# Patient Record
Sex: Female | Born: 1990
Health system: Southern US, Community
[De-identification: ages and names within clinical notes are randomized; demographics above are authoritative.]

## PROBLEM LIST (undated history)

## (undated) DIAGNOSIS — I10 Essential (primary) hypertension: Secondary | ICD-10-CM

---
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
## 2010-04-03 ENCOUNTER — Emergency Department (HOSPITAL_COMMUNITY): Admission: EM | Admit: 2010-04-03 | Discharge: 2010-04-03 | Payer: Self-pay | Admitting: Family Medicine

## 2016-10-07 ENCOUNTER — Emergency Department (HOSPITAL_COMMUNITY): Payer: Self-pay

## 2016-10-07 ENCOUNTER — Emergency Department (HOSPITAL_COMMUNITY)
Admission: EM | Admit: 2016-10-07 | Discharge: 2016-10-07 | Disposition: A | Discharge status: Home / Self Care | Payer: Self-pay | Attending: Emergency Medicine | Admitting: Emergency Medicine

## 2016-10-07 ENCOUNTER — Encounter (HOSPITAL_COMMUNITY): Payer: Self-pay

## 2016-10-07 DIAGNOSIS — X58XXXA Exposure to other specified factors, initial encounter: Secondary | ICD-10-CM | POA: Insufficient documentation

## 2016-10-07 DIAGNOSIS — Y990 Civilian activity done for income or pay: Secondary | ICD-10-CM | POA: Insufficient documentation

## 2016-10-07 DIAGNOSIS — Y9389 Activity, other specified: Secondary | ICD-10-CM | POA: Insufficient documentation

## 2016-10-07 DIAGNOSIS — Y9289 Other specified places as the place of occurrence of the external cause: Secondary | ICD-10-CM | POA: Insufficient documentation

## 2016-10-07 DIAGNOSIS — R079 Chest pain, unspecified: Secondary | ICD-10-CM | POA: Insufficient documentation

## 2016-10-07 DIAGNOSIS — F17210 Nicotine dependence, cigarettes, uncomplicated: Secondary | ICD-10-CM | POA: Insufficient documentation

## 2016-10-07 LAB — COMPREHENSIVE METABOLIC PANEL
ALK PHOS: 81 U/L (ref 38–126)
ALT: 18 U/L (ref 14–54)
ANION GAP: 10 (ref 5–15)
AST: 22 U/L (ref 15–41)
Albumin: 3.8 g/dL (ref 3.5–5.0)
BILIRUBIN TOTAL: 0.5 mg/dL (ref 0.3–1.2)
BUN: 10 mg/dL (ref 6–20)
CALCIUM: 9.3 mg/dL (ref 8.9–10.3)
CO2: 24 mmol/L (ref 22–32)
Chloride: 103 mmol/L (ref 101–111)
Creatinine, Ser: 0.8 mg/dL (ref 0.44–1.00)
GFR calc Af Amer: >60 mL/min (ref >60)
Glucose, Bld: 84 mg/dL (ref 65–99)
POTASSIUM: 3.9 mmol/L (ref 3.5–5.1)
Sodium: 137 mmol/L (ref 135–145)
TOTAL PROTEIN: 7.1 g/dL (ref 6.5–8.1)

## 2016-10-07 LAB — CBC WITH DIFFERENTIAL/PLATELET
BASOS ABS: 0 10*3/uL (ref 0.0–0.1)
BASOS PCT: 0 %
EOS PCT: 1 %
Eosinophils Absolute: 0.1 10*3/uL (ref 0.0–0.7)
HCT: 37.6 % (ref 36.0–46.0)
Hemoglobin: 11.9 g/dL — ABNORMAL LOW (ref 12.0–15.0)
LYMPHS PCT: 38 %
Lymphs Abs: 3.3 10*3/uL (ref 0.7–4.0)
MCH: 24.6 pg — ABNORMAL LOW (ref 26.0–34.0)
MCHC: 31.6 g/dL (ref 30.0–36.0)
MCV: 77.8 fL — AB (ref 78.0–100.0)
Monocytes Absolute: 0.5 10*3/uL (ref 0.1–1.0)
Monocytes Relative: 6 %
Neutro Abs: 4.8 10*3/uL (ref 1.7–7.7)
Neutrophils Relative %: 55 %
PLATELETS: 248 10*3/uL (ref 150–400)
RBC: 4.83 MIL/uL (ref 3.87–5.11)
RDW: 14 % (ref 11.5–15.5)
WBC: 8.7 10*3/uL (ref 4.0–10.5)

## 2016-10-07 LAB — I-STAT TROPONIN, ED: TROPONIN I, POC: 0 ng/mL (ref 0.00–0.08)

## 2016-10-07 LAB — I-STAT BETA HCG BLOOD, ED (MC, WL, AP ONLY)

## 2016-10-07 NOTE — ED Notes (Signed)
EDP at bedside  

## 2016-10-07 NOTE — ED Notes (Signed)
Pt. Ambulated independently to restroom with steady gait. Pt. Instructed on clean catch technique for urine specimen.

## 2016-10-07 NOTE — Discharge Instructions (Signed)
Ibuprofen 600 mg every 6 hours as needed for pain.  Follow-up with primary Dr. if not improving in the next week, and return to the emergency department if your symptoms significantly worsen or change.

## 2016-10-07 NOTE — ED Triage Notes (Signed)
Pt. Coming from work via [facility] for central  Chest pain starting at 0930. Pt. Denies radiation or any accompanying symptoms. Pt. Given 324 ASA en route with relief of pain. Pt. Denies any medical hx, but does smoke cigarettes daily. Family hx of CAD. Pt. Aox4. 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 Pt. Pain free at this time.

## 2016-10-07 NOTE — ED Notes (Signed)
Patient transported to X-ray 

## 2016-10-07 NOTE — ED Provider Notes (Signed)
[facility] Provider Note   CSN: [id number] Arrival date & time: 10/07/16  1047     History   Chief Complaint Chief Complaint  Patient presents with  . Chest Pain    HPI Sarah Small is a 26 y.o. female.  Patient is a 26 year old female with no significant past medical history. She presents for evaluation of chest pain. This started approximately 2 hours prior to arrival. 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 While pushing the cart, she developed the acute onset of sharp pain in the center of her chest. This was sharp in nature and made her feel short winded. She denied any nausea or diaphoresis. The pain lasted for approximately 30 minutes and has now resolved.  Patient has a family history of cardiac disease and does smoke cigarettes, however has no other cardiac risk factors.   The history is provided by the patient.  Chest Pain      History reviewed. No pertinent past medical history.  There are no active problems to display for this patient.   History reviewed. No pertinent surgical history.  OB History    No data available       Home Medications    Prior to Admission medications   Not on File    Family History History reviewed. No pertinent family history.  Social History Social History  Substance Use Topics  . Smoking status: Current Every Day Smoker    Packs/day: 0.50    Years: 9.00    Types: Cigarettes  . Smokeless tobacco: Never Used  . Alcohol use No     Allergies   Ritalin [methylphenidate hcl]   Review of Systems Review of Systems  Cardiovascular: Positive for chest pain.     Physical Exam Updated Vital Signs BP (!) 153/81 (BP Location: Right Arm)   Pulse (!) 54   Temp 98.1 F (36.7 C) (Oral)   Resp 16   Ht  (1.626 m)   Wt 182 lb (82.6 kg)   LMP 08/15/2016 (Approximate)   SpO2 100%   BMI 31.24 kg/m   Physical Exam   ED Treatments / Results  Labs (all  labs ordered are listed, but only abnormal results are displayed) Labs Reviewed  COMPREHENSIVE METABOLIC PANEL  CBC WITH DIFFERENTIAL/PLATELET  I-STAT TROPOININ, ED    EKG  EKG Interpretation  Date/Time:  Monday October 07 2016 10:57:37 EDT Ventricular Rate:  58 PR Interval:    QRS Duration: 84 QT Interval:  415 QTC Calculation: 408 R Axis:   48 Text Interpretation:  Sinus rhythm Borderline T abnormalities, inferior leads Confirmed by Kiylah Loyer  MD, Sadeel Fiddler ([id number]) on 10/07/2016 12:39:09 PM       Radiology No results found.  Procedures Procedures (including critical care time)  Medications Ordered in ED Medications - No data to display   Initial Impression / Assessment and Plan / ED Course  I have reviewed the triage vital signs and the nursing notes.  Pertinent labs & imaging results that were available during my care of the patient were reviewed by me and considered in my medical decision making (see chart for details).  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 Her pain is described as sharp in nature in the center of her chest. It lasted for approximately 30 minutes, then resolved. There is nothing in today's workup to suggest a cardiac etiology. Her EKG is normal and  chest x-ray is clear. She has no significant cardiac risk factors and is premenopausal. She will be discharged with anti-inflammatories and follow-up as needed.  Final Clinical Impressions(s) / ED Diagnoses   Final diagnoses:  None    New Prescriptions New Prescriptions   No medications on file     Geoffery Lyons, MD 10/07/16 1244

## 2016-10-20 ENCOUNTER — Ambulatory Visit (HOSPITAL_COMMUNITY)
Admission: EM | Admit: 2016-10-20 | Discharge: 2016-10-20 | Disposition: A | Discharge status: Home / Self Care | Payer: Self-pay | Attending: Internal Medicine | Admitting: Internal Medicine

## 2016-10-20 ENCOUNTER — Encounter (HOSPITAL_COMMUNITY): Payer: Self-pay | Admitting: Emergency Medicine

## 2016-10-20 DIAGNOSIS — R197 Diarrhea, unspecified: Secondary | ICD-10-CM

## 2016-10-20 DIAGNOSIS — R112 Nausea with vomiting, unspecified: Secondary | ICD-10-CM

## 2016-10-20 DIAGNOSIS — R109 Unspecified abdominal pain: Secondary | ICD-10-CM

## 2016-10-20 MED ORDER — ONDANSETRON 4 MG PO TBDP
4.0000 mg | ORAL_TABLET | Freq: Once | ORAL | Status: AC
  Administered 2016-10-20: 4 mg via ORAL

## 2016-10-20 MED ORDER — ONDANSETRON 4 MG PO TBDP: 4.0000 mg | ORAL_TABLET | Freq: Three times a day (TID) | ORAL | 0 refills | Status: DC | PRN

## 2016-10-20 MED ORDER — ONDANSETRON 4 MG PO TBDP
ORAL_TABLET | ORAL | Status: AC
  Filled 2016-10-20: qty 1

## 2016-10-20 NOTE — Discharge Instructions (Signed)
You were given Zofran tablet today to help with nausea and vomiting. Recommend continue Zofran 4mg  every 8 hours as needed. Slowly increase fluid intake and eat bland foods as tolerated. No fried or spicy foods. Follow-up in 2 to 3 days if not improving.

## 2016-10-20 NOTE — ED Triage Notes (Signed)
[facility] a [facility] abdominal cramping with N/V/D that started this am.

## 2016-10-21 NOTE — ED Provider Notes (Signed)
CSN: [id number]     Arrival date & time 10/20/16  1906 History   First MD Initiated Contact with Patient 10/20/16 2115     Chief Complaint  Patient presents with  . Abdominal Cramping   (Consider location/radiation/quality/duration/timing/severity/associated sxs/prior Treatment) 26 year old female presents with nausea, vomiting, diarrhea and abdominal cramping that started early this morning (3AM). She denies any fever, URI, urinary or vaginal symptoms. Last vomited mid-afternoon. No blood seen. Has tried Pepto-Bismul and water with minimal success. Cousin was sick with similar symptoms 2 days ago. No chronic health issues. Takes no daily medication.    The history is provided by the patient.    History reviewed. No pertinent past medical history. History reviewed. No pertinent surgical history. History reviewed. No pertinent family history. Social History  Substance Use Topics  . Smoking status: Current Every Day Smoker    Packs/day: 0.50    Years: 9.00    Types: Cigarettes  . Smokeless tobacco: Never Used  . Alcohol use No   OB History    No data available     Review of Systems  Constitutional: Positive for appetite change, chills and fatigue. Negative for fever.  HENT: Negative for congestion and sore throat.   Respiratory: Negative for cough, chest tightness and shortness of breath.   Cardiovascular: Negative for chest pain.  Gastrointestinal: Positive for abdominal pain, diarrhea, nausea and vomiting. Negative for blood in stool.  Genitourinary: Negative for difficulty urinating, dysuria, flank pain, hematuria, vaginal discharge and vaginal pain.  Musculoskeletal: Negative for arthralgias, back pain, myalgias and neck pain.  Skin: Negative for rash and wound.  Allergic/Immunologic: Negative for immunocompromised state.  Neurological: Positive for headaches. Negative for dizziness, syncope, weakness, light-headedness and numbness.  Hematological: Negative for adenopathy.     Allergies  Ritalin [methylphenidate hcl]  Home Medications   Prior to Admission medications   Medication Sig Start Date End Date Taking? Authorizing Provider  ondansetron (ZOFRAN ODT) 4 MG disintegrating tablet Take 1 tablet (4 mg total) by mouth every 8 (eight) hours as needed for nausea or vomiting. 10/20/16   Sudie Grumbling, NP   Meds Ordered and Administered this Visit   Medications  ondansetron (ZOFRAN-ODT) disintegrating tablet 4 mg (4 mg Oral Given 10/20/16 2139)    BP 118/75 (BP Location: Right Arm)   Pulse 79   Temp 98.9 F (37.2 C) (Oral)   Resp 18   SpO2 100%  No data found.   Physical Exam  Constitutional: She is oriented to person, place, and time. She appears well-developed and well-nourished. She appears ill. No distress.  HENT:  Head: Normocephalic and atraumatic.  Right Ear: Hearing, tympanic membrane, external ear and ear canal normal.  Left Ear: Hearing, tympanic membrane, external ear and ear canal normal.  Nose: Nose normal.  Mouth/Throat: Uvula is midline, oropharynx is clear and moist and mucous membranes are normal.  Neck: Normal range of motion. Neck supple.  Cardiovascular: Normal rate, regular rhythm and normal heart sounds.   No murmur heard. Pulmonary/Chest: Effort normal and breath sounds normal. No respiratory distress.  Abdominal: Soft. Bowel sounds are normal. There is no hepatosplenomegaly. There is generalized tenderness. There is no rigidity, no rebound, no guarding and no CVA tenderness.  Musculoskeletal: Normal range of motion.  Lymphadenopathy:    She has no cervical adenopathy.  Neurological: She is alert and oriented to person, place, and time.  Skin: Skin is warm and dry.  Psychiatric: She has a normal mood and affect. Her  behavior is normal. Judgment and thought content normal.    Urgent Care Course     Procedures (including critical care time)  Labs Review Labs Reviewed - No data to display  Imaging Review No  results found.   Visual Acuity Review  Right Eye Distance:   Left Eye Distance:   Bilateral Distance:    Right Eye Near:   Left Eye Near:    Bilateral Near:         MDM   1. Nausea vomiting and diarrhea   2. Abdominal cramping    Discussed that she probably has a viral GI illness. Recommend take Zofran 4mg  every 8 hours as needed for nausea and vomiting. Discussed that symptoms should improve within 24 to 48 hours. Recommend clear fluids later today- advance to bland foods as tolerated. Note written for work. Follow-up in 2 to 3 days if not improving or go to ER if symptoms worsen.     Sudie GrumblingAmyot, Dennys Traughber Berry, NP 10/21/16 1244

## 2017-03-04 ENCOUNTER — Encounter (HOSPITAL_COMMUNITY): Payer: Self-pay | Admitting: Emergency Medicine

## 2017-03-04 ENCOUNTER — Ambulatory Visit (HOSPITAL_COMMUNITY)
Admission: EM | Admit: 2017-03-04 | Discharge: 2017-03-04 | Disposition: A | Discharge status: Home / Self Care | Payer: Self-pay | Attending: Family Medicine | Admitting: Family Medicine

## 2017-03-04 DIAGNOSIS — R05 Cough: Secondary | ICD-10-CM

## 2017-03-04 DIAGNOSIS — B9789 Other viral agents as the cause of diseases classified elsewhere: Secondary | ICD-10-CM

## 2017-03-04 DIAGNOSIS — J069 Acute upper respiratory infection, unspecified: Secondary | ICD-10-CM

## 2017-03-04 MED ORDER — ALBUTEROL SULFATE HFA 108 (90 BASE) MCG/ACT IN AERS: 1.0000 | INHALATION_SPRAY | Freq: Four times a day (QID) | RESPIRATORY_TRACT | 0 refills | Status: DC | PRN

## 2017-03-04 MED ORDER — FLUTICASONE PROPIONATE 50 MCG/ACT NA SUSP: 2.0000 | Freq: Every day | NASAL | 0 refills | Status: DC

## 2017-03-04 MED ORDER — ALBUTEROL SULFATE (2.5 MG/3ML) 0.083% IN NEBU
2.5000 mg | INHALATION_SOLUTION | Freq: Once | RESPIRATORY_TRACT | Status: AC
  Administered 2017-03-04: 2.5 mg via RESPIRATORY_TRACT

## 2017-03-04 MED ORDER — AEROCHAMBER PLUS FLO-VU LARGE MISC
Status: AC
  Filled 2017-03-04: qty 1

## 2017-03-04 MED ORDER — BENZONATATE 100 MG PO CAPS: 100.0000 mg | ORAL_CAPSULE | Freq: Three times a day (TID) | ORAL | 0 refills | Status: DC

## 2017-03-04 MED ORDER — ALBUTEROL SULFATE (2.5 MG/3ML) 0.083% IN NEBU
INHALATION_SOLUTION | RESPIRATORY_TRACT | Status: AC
  Filled 2017-03-04: qty 3

## 2017-03-04 MED ORDER — AEROCHAMBER PLUS FLO-VU MEDIUM MISC
1.0000 | Freq: Once | Status: AC
  Administered 2017-03-04: 1

## 2017-03-04 MED ORDER — CETIRIZINE-PSEUDOEPHEDRINE ER 5-120 MG PO TB12: 1.0000 | ORAL_TABLET | Freq: Every day | ORAL | 0 refills | Status: DC

## 2017-03-04 NOTE — ED Triage Notes (Signed)
PT reports new onset productive, barking cough that started yesterday. PT is coughing so hard she gags and vomits.

## 2017-03-04 NOTE — Discharge Instructions (Signed)
Tessalon for cough. Albuterol inhaler with spacer as needed for shortness of breath, wheezing. Start flonase, zyrtec-D for nasal congestion. You can use over the counter nasal saline rinse such as neti pot for nasal congestion. Keep hydrated, your urine should be clear to pale yellow in color. Tylenol/motrin for fever and pain. Encourage smoking cessation. Monitor for any worsening of symptoms, chest pain, shortness of breath, wheezing, swelling of the throat, follow up for reevaluation.

## 2017-03-04 NOTE — ED Provider Notes (Signed)
[facility]    CSN: [id number] Arrival date & time: 03/04/17  1244     History   Chief Complaint Chief Complaint  Patient presents with  . Cough    HPI Sarah Small is a 26 y.o. female.   26 year old female comes in for 2 day history of cough, vomiting due to cough and shortness of breath. Having trouble breathing and feels swelling of the throat, no trouble swallowing. 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 Tried cough drops, hot fluids without relief. States eye watering with rhinorrhea when she coughs. Denies nasal congestion, fever, chills, night sweats. Denies abdominal pain, nausea, diarrhea, constipation. No history of asthma. Current everyday smoker for 5 years, 1-2 pack year history. Negative sick contact. Up to date on immunization.       History reviewed. No pertinent past medical history.  There are no active problems to display for this patient.   History reviewed. No pertinent surgical history.  OB History    No data available       Home Medications    Prior to Admission medications   Medication Sig Start Date End Date Taking? Authorizing Provider  albuterol (PROVENTIL HFA;VENTOLIN HFA) 108 (90 Base) MCG/ACT inhaler Inhale 1-2 puffs into the lungs every 6 (six) hours as needed for wheezing or shortness of breath. 03/04/17   Cathie Hoops, Graesyn Schreifels V, PA-C  benzonatate (TESSALON) 100 MG capsule Take 1 capsule (100 mg total) by mouth every 8 (eight) hours. 03/04/17   Cathie Hoops, Auden Wettstein V, PA-C  cetirizine-pseudoephedrine (ZYRTEC-D) 5-120 MG tablet Take 1 tablet by mouth daily. 03/04/17   Cathie Hoops, Xaivier Malay V, PA-C  fluticasone (FLONASE) 50 MCG/ACT nasal spray Place 2 sprays into both nostrils daily. 03/04/17   Belinda Fisher, PA-C    Family History No family history on file.  Social History Social History  Substance Use Topics  . Smoking status: Current Every Day Smoker    Packs/day: 0.25    Types: Cigarettes  . Smokeless tobacco:  Never Used  . Alcohol use No     Allergies   Ritalin [methylphenidate hcl]   Review of Systems Review of Systems  Reason unable to perform ROS: See HPI as above.     Physical Exam Triage Vital Signs ED Triage Vitals  Enc Vitals Group     BP 03/04/17 1315 134/88     Pulse Rate 03/04/17 1315 77     Resp 03/04/17 1315 16     Temp 03/04/17 1315 98.6 F (37 C)     Temp Source 03/04/17 1315 Oral     SpO2 03/04/17 1315 99 %     Weight 03/04/17 1314 189 lb (85.7 kg)     Height 03/04/17 1314  (1.626 m)     Head Circumference --      Peak Flow --      Pain Score 03/04/17 1315 8     Pain Loc --      Pain Edu? --      Excl. in GC? --    No data found.   Updated Vital Signs BP 134/88   Pulse 77   Temp 98.6 F (37 C) (Oral)   Resp 16   Ht  (1.626 m)   Wt 189 lb (85.7 kg)   LMP 02/13/2017   SpO2 99%   BMI 32.44 kg/m   Physical Exam  Constitutional: She is oriented to person, place, and time. She appears well-developed  and well-nourished. No distress.  Patient continues to cough throughout exam and providing HPI  HENT:  Head: Normocephalic and atraumatic.  Right Ear: External ear and ear canal normal. Tympanic membrane is erythematous. Tympanic membrane is not bulging.  Left Ear: Tympanic membrane, external ear and ear canal normal. Tympanic membrane is not erythematous and not bulging.  Nose: Mucosal edema and rhinorrhea present. Right sinus exhibits no maxillary sinus tenderness and no frontal sinus tenderness. Left sinus exhibits no maxillary sinus tenderness and no frontal sinus tenderness.  Mouth/Throat: Uvula is midline and mucous membranes are normal. No uvula swelling. Posterior oropharyngeal erythema present. No posterior oropharyngeal edema. Tonsils are 2+ on the right. Tonsils are 2+ on the left. No tonsillar exudate.  Eyes: Pupils are equal, round, and reactive to light. Conjunctivae are normal.  Neck: Normal range of motion. Neck supple.    Cardiovascular: Normal rate, regular rhythm and normal heart sounds.  Exam reveals no gallop and no friction rub.   No murmur heard. Pulmonary/Chest: Effort normal and breath sounds normal. She has no decreased breath sounds. She has no wheezes. She has no rhonchi. She has no rales.  Lymphadenopathy:    She has no cervical adenopathy.  Neurological: She is alert and oriented to person, place, and time.  Skin: Skin is warm and dry.  Psychiatric: She has a normal mood and affect. Her behavior is normal. Judgment normal.     UC Treatments / Results  Labs (all labs ordered are listed, but only abnormal results are displayed) Labs Reviewed - No data to display  EKG  EKG Interpretation None       Radiology No results found.  Procedures Procedures (including critical care time)  Medications Ordered in UC Medications  albuterol (PROVENTIL) (2.5 MG/3ML) 0.083% nebulizer solution 2.5 mg (2.5 mg Nebulization Given 03/04/17 1337)  AEROCHAMBER PLUS FLO-VU MEDIUM MISC 1 each (1 each Other Given 03/04/17 1413)     Initial Impression / Assessment and Plan / UC Course  I have reviewed the triage vital signs and the nursing notes.  Pertinent labs & imaging results that were available during my care of the patient were reviewed by me and considered in my medical decision making (see chart for details).    Patient states symptoms improved after albuterol treatment. Will provide albuterol inhaler with spacer. Other symptomatic treatment as needed. Push fluids. Encouraged smoking cessation. Return precautions given.   Final Clinical Impressions(s) / UC Diagnoses   Final diagnoses:  Viral URI with cough    New Prescriptions Discharge Medication List as of 03/04/2017  2:05 PM    START taking these medications   Details  albuterol (PROVENTIL HFA;VENTOLIN HFA) 108 (90 Base) MCG/ACT inhaler Inhale 1-2 puffs into the lungs every 6 (six) hours as needed for wheezing or shortness of breath.,  Starting Tue 03/04/2017, Normal    benzonatate (TESSALON) 100 MG capsule Take 1 capsule (100 mg total) by mouth every 8 (eight) hours., Starting Tue 03/04/2017, Normal    cetirizine-pseudoephedrine (ZYRTEC-D) 5-120 MG tablet Take 1 tablet by mouth daily., Starting Tue 03/04/2017, Normal    fluticasone (FLONASE) 50 MCG/ACT nasal spray Place 2 sprays into both nostrils daily., Starting Tue 03/04/2017, Normal           Linward Headland V, PA-C 03/04/17 1436

## 2017-03-06 ENCOUNTER — Encounter (HOSPITAL_COMMUNITY): Payer: Self-pay | Admitting: Emergency Medicine

## 2017-09-23 ENCOUNTER — Encounter (HOSPITAL_COMMUNITY): Payer: Self-pay | Admitting: Emergency Medicine

## 2017-09-23 ENCOUNTER — Ambulatory Visit (HOSPITAL_COMMUNITY)
Admission: EM | Admit: 2017-09-23 | Discharge: 2017-09-23 | Disposition: A | Discharge status: Home / Self Care | Payer: Self-pay | Attending: Family Medicine | Admitting: Family Medicine

## 2017-09-23 DIAGNOSIS — L03317 Cellulitis of buttock: Secondary | ICD-10-CM

## 2017-09-23 MED ORDER — SULFAMETHOXAZOLE-TRIMETHOPRIM 800-160 MG PO TABS: 1.0000 | ORAL_TABLET | Freq: Two times a day (BID) | ORAL | 0 refills | Status: AC

## 2017-09-23 NOTE — ED Triage Notes (Signed)
Pt c/o abscess on her right buttocks x4 years

## 2017-09-24 NOTE — ED Provider Notes (Signed)
  [facility]   [id number] 09/23/17 Arrival Time: 1550  ASSESSMENT & PLAN:  1. Cellulitis of buttock     Meds ordered this encounter  Medications  . sulfamethoxazole-trimethoprim (BACTRIM DS,SEPTRA DS) 800-160 MG tablet    Sig: Take 1 tablet by mouth 2 (two) times daily for 10 days.    Dispense:  20 tablet    Refill:  0   If acutely worsening she may f/u here.  Reviewed expectations re: course of current medical issues. Questions answered. Outlined signs and symptoms indicating need for more acute intervention. Patient verbalized understanding. After Visit Summary given.   SUBJECTIVE:  Sarah Small is a 27 y.o. female who presents with a possible "infection" of her R buttock. On/off in this area over the past few years. Now present 1-2 days.. Tender. No drainage. Afebrile. No home treatment.  ROS: As per HPI.  OBJECTIVE:  Vitals:   09/23/17 1601  BP: 128/78  Pulse: 70  Resp: 18  Temp: 98.3 F (36.8 C)  SpO2: 100%     General appearance: alert; no distress Skin: superior inner R buttock with 1.5cm area of erythema; tender to touch; no areas of fluctuance Psychological: alert and cooperative; normal mood and affect  Allergies  Allergen Reactions  . Ritalin [Methylphenidate Hcl] Rash   Social History   Socioeconomic History  . Marital status: Single    Spouse name: Not on file  . Number of children: Not on file  . Years of education: Not on file  . Highest education level: Not on file  Occupational History  . Not on file  Social Needs  . Financial resource strain: Not on file  . Food insecurity:    Worry: Not on file    Inability: Not on file  . Transportation needs:    Medical: Not on file    Non-medical: Not on file  Tobacco Use  . Smoking status: Current Every Day Smoker    Packs/day: 0.25    Years: 9.00    Pack years: 2.25    Types: Cigarettes  . Smokeless tobacco: Never Used  Substance and Sexual Activity  . Alcohol use: No  .  Drug use: No    Types: Marijuana  . Sexual activity: Never  Lifestyle  . Physical activity:    Days per week: Not on file    Minutes per session: Not on file  . Stress: Not on file  Relationships  . Social connections:    Talks on phone: Not on file    Gets together: Not on file    Attends religious service: Not on file    Active member of club or organization: Not on file    Attends meetings of clubs or organizations: Not on file    Relationship status: Not on file  Other Topics Concern  . Not on file  Social History Narrative   ** Merged History Encounter **       No family history on file. History reviewed. No pertinent surgical history.         Mardella LaymanHagler, Josie Mesa, MD 09/24/17 [phone]

## 2018-01-13 ENCOUNTER — Encounter (HOSPITAL_COMMUNITY): Payer: Self-pay | Admitting: Emergency Medicine

## 2018-01-13 ENCOUNTER — Emergency Department (HOSPITAL_COMMUNITY)
Admission: EM | Admit: 2018-01-13 | Discharge: 2018-01-13 | Disposition: A | Discharge status: Home / Self Care | Payer: Self-pay | Attending: Emergency Medicine | Admitting: Emergency Medicine

## 2018-01-13 ENCOUNTER — Other Ambulatory Visit: Payer: Self-pay

## 2018-01-13 DIAGNOSIS — F17210 Nicotine dependence, cigarettes, uncomplicated: Secondary | ICD-10-CM | POA: Insufficient documentation

## 2018-01-13 DIAGNOSIS — L0231 Cutaneous abscess of buttock: Secondary | ICD-10-CM | POA: Insufficient documentation

## 2018-01-13 DIAGNOSIS — Z79899 Other long term (current) drug therapy: Secondary | ICD-10-CM | POA: Insufficient documentation

## 2018-01-13 DIAGNOSIS — F1210 Cannabis abuse, uncomplicated: Secondary | ICD-10-CM | POA: Insufficient documentation

## 2018-01-13 MED ORDER — CEPHALEXIN 500 MG PO CAPS: 500.0000 mg | ORAL_CAPSULE | Freq: Four times a day (QID) | ORAL | 0 refills | Status: DC

## 2018-01-13 MED ORDER — LIDOCAINE-EPINEPHRINE (PF) 2 %-1:200000 IJ SOLN
10.0000 mL | Freq: Once | INTRAMUSCULAR | Status: AC
Start: 2018-01-13 — End: 2018-01-13
  Administered 2018-01-13: 10 mL
  Filled 2018-01-13: qty 20

## 2018-01-13 NOTE — ED Triage Notes (Signed)
Pt reports abscess to R of gluteal fold that's "been there for 4323yrs". Reports that having gotten significantly larger and more painful over the past 24-36hrs. Hx of similar. See at urgent care and given abx that didn't help much. Pt afebrile, ambulatory at triage.

## 2018-01-13 NOTE — ED Provider Notes (Signed)
[facility] EMERGENCY DEPARTMENT Provider Note   CSN: [id number] Arrival date & time: 01/13/18  1909     History   Chief Complaint Chief Complaint  Patient presents with  . Abscess    HPI Pat Elicker is a 27 y.o. female who presents to ED for evaluation of right buttock abscess that has worsened over the past 2 days.  She had a history of abscess in the area similarly on and off for the past 4 years.  States that in the past she has been treated with antibiotics which does not usually help her symptoms.  She denies any drainage from the area.  This has never been attempted to be incised or drained in the past.  Denies any fever, trauma to area, changes in bowel movements.  HPI  History reviewed. No pertinent past medical history.  There are no active problems to display for this patient.   History reviewed. No pertinent surgical history.   OB History   None      Home Medications    Prior to Admission medications   Medication Sig Start Date End Date Taking? Authorizing Provider  albuterol (PROVENTIL HFA;VENTOLIN HFA) 108 (90 Base) MCG/ACT inhaler Inhale 1-2 puffs into the lungs every 6 (six) hours as needed for wheezing or shortness of breath. 03/04/17   Cathie Hoops, Amy V, PA-C  benzonatate (TESSALON) 100 MG capsule Take 1 capsule (100 mg total) by mouth every 8 (eight) hours. 03/04/17   Cathie Hoops, Amy V, PA-C  cephALEXin (KEFLEX) 500 MG capsule Take 1 capsule (500 mg total) by mouth 4 (four) times daily. 01/13/18   Karnell Vanderloop, PA-C  cetirizine-pseudoephedrine (ZYRTEC-D) 5-120 MG tablet Take 1 tablet by mouth daily. 03/04/17   Cathie Hoops, Amy V, PA-C  fluticasone (FLONASE) 50 MCG/ACT nasal spray Place 2 sprays into both nostrils daily. 03/04/17   Cathie Hoops, Amy V, PA-C  ondansetron (ZOFRAN ODT) 4 MG disintegrating tablet Take 1 tablet (4 mg total) by mouth every 8 (eight) hours as needed for nausea or vomiting. 10/20/16   Sudie Grumbling, NP    Family History History reviewed. No  pertinent family history.  Social History Social History   Tobacco Use  . Smoking status: Current Every Day Smoker    Packs/day: 0.25    Years: 9.00    Pack years: 2.25    Types: Cigarettes  . Smokeless tobacco: Never Used  Substance Use Topics  . Alcohol use: No  . Drug use: No    Types: Marijuana     Allergies   Ritalin [methylphenidate hcl]   Review of Systems Review of Systems  Constitutional: Negative for chills and fever.  Gastrointestinal: Negative for constipation and diarrhea.  Skin: Positive for color change.     Physical Exam Updated Vital Signs BP 128/80 (BP Location: Right Arm)   Pulse 80   Temp 97.9 F (36.6 C) (Oral)   Resp 16   Ht 5\' 4"  (1.626 m)   Wt 85.7 kg (189 lb)   LMP 11/29/2017 (Exact Date)   SpO2 100%   BMI 32.44 kg/m   Physical Exam  Constitutional: She appears well-developed and well-nourished. No distress.  HENT:  Head: Normocephalic and atraumatic.  Eyes: Conjunctivae and EOM are normal. No scleral icterus.  Neck: Normal range of motion.  Pulmonary/Chest: Effort normal. No respiratory distress.  Neurological: She is alert.  Skin: No rash noted. She is not diaphoretic.  2x2 cm abscess noted to R buttock skin with central fluctuance.   Psychiatric:  She has a normal mood and affect.  Nursing note and vitals reviewed.    ED Treatments / Results  Labs (all labs ordered are listed, but only abnormal results are displayed) Labs Reviewed - No data to display  EKG None  Radiology No results found.  Procedures .Marland Kitchen.Incision and Drainage Date/Time: 01/13/2018 9:07 PM Performed by: Eyvonne MechanicHedges, Jeffrey, PA-C Authorized by: Dietrich PatesKhatri, Dwayne Begay, PA-C   Consent:    Consent obtained:  Verbal   Consent given by:  Patient   Risks discussed:  Bleeding, damage to other organs, incomplete drainage and infection Location:    Type:  Abscess   Location: R buttock. Anesthesia (see MAR for exact dosages):    Anesthesia method:  Local  infiltration   Local anesthetic:  Lidocaine 2% WITH epi Procedure details:    Incision types:  Stab incision   Incision depth:  Dermal   Scalpel blade:  11   Wound management:  Probed and deloculated and irrigated with saline   Drainage:  Purulent and bloody   Drainage amount:  Moderate   Packing materials:  1/4 in gauze Post-procedure details:    Patient tolerance of procedure:  Tolerated well, no immediate complications   (including critical care time)  Medications Ordered in ED Medications  lidocaine-EPINEPHrine (XYLOCAINE W/EPI) 2 %-1:200000 (PF) injection 10 mL (10 mLs Infiltration Given 01/13/18 2028)     Initial Impression / Assessment and Plan / ED Course  I have reviewed the triage vital signs and the nursing notes.  Pertinent labs & imaging results that were available during my care of the patient were reviewed by me and considered in my medical decision making (see chart for details).      Patient with skin abscess. Incision and drainage performed in the ED today with purulent drainage noted.  Abscess was not large enough to warrant packing or drain placement. No signs of systemic illness present. Advised patient to return for wound recheck in 2 days, sooner if signs of infection or increased bleeding/drainage noted. Supportive care and return precautions discussed.  Pt sent home with Keflex. The patient appears reasonably screened and/or stabilized for discharge. Strict return precautions given.  Portions of this note were generated with Scientist, clinical (histocompatibility and immunogenetics)Dragon dictation software. Dictation errors may occur despite best attempts at proofreading.  Final Clinical Impressions(s) / ED Diagnoses   Final diagnoses:  Abscess of buttock, right    ED Discharge Orders        Ordered    cephALEXin (KEFLEX) 500 MG capsule  4 times daily     01/13/18 2105       Dietrich PatesKhatri, Jimmey Hengel, PA-C 01/13/18 2109    Azalia Bilisampos, Kevin, MD 01/14/18 [phone]

## 2018-01-13 NOTE — ED Notes (Signed)
Patient able to ambulate independently  

## 2018-01-13 NOTE — Discharge Instructions (Signed)
Return to ED for worsening symptoms, fever, increased drainage from site, pain with bowel movements.

## 2018-08-14 ENCOUNTER — Ambulatory Visit
Admission: EM | Admit: 2018-08-14 | Discharge: 2018-08-14 | Disposition: A | Discharge status: Home / Self Care | Payer: Self-pay | Attending: Family Medicine | Admitting: Family Medicine

## 2018-08-14 DIAGNOSIS — J988 Other specified respiratory disorders: Secondary | ICD-10-CM

## 2018-08-14 DIAGNOSIS — B9789 Other viral agents as the cause of diseases classified elsewhere: Secondary | ICD-10-CM

## 2018-08-14 MED ORDER — BENZONATATE 200 MG PO CAPS: 200.0000 mg | ORAL_CAPSULE | Freq: Three times a day (TID) | ORAL | 0 refills | Status: DC | PRN

## 2018-08-14 MED ORDER — ALBUTEROL SULFATE HFA 108 (90 BASE) MCG/ACT IN AERS: 1.0000 | INHALATION_SPRAY | Freq: Four times a day (QID) | RESPIRATORY_TRACT | 0 refills | Status: DC | PRN

## 2018-08-14 NOTE — ED Provider Notes (Signed)
[facility]    CSN: [id number] Arrival date & time: 08/14/18  1324  History   Chief Complaint Chief Complaint  Patient presents with  . Cough   HPI  28 year old female presents with cough and shortness of breath.  Patient reports that she is been sick for the past 3 days.  Reports cough and chest congestion.  Associated shortness of breath.  Patient also reports diarrhea.  No documented fever.  No medications or interventions tried.  Patient continues to smoke.  Patient also reports that she has some lesions around her mouth/lips.  No other associated symptoms.  No other complaints or concerns at this time.  History reviewed as below. PMH: Hx of abscess  Home Medications    Prior to Admission medications   Medication Sig Start Date End Date Taking? Authorizing Provider  albuterol (PROVENTIL HFA;VENTOLIN HFA) 108 (90 Base) MCG/ACT inhaler Inhale 1-2 puffs into the lungs every 6 (six) hours as needed for wheezing or shortness of breath. 08/14/18   Tommie Sams, DO  benzonatate (TESSALON) 200 MG capsule Take 1 capsule (200 mg total) by mouth 3 (three) times daily as needed for cough. 08/14/18   Tommie Sams, DO   Social History Social History   Tobacco Use  . Smoking status: Current Every Day Smoker    Packs/day: 0.25    Years: 9.00    Pack years: 2.25    Types: Cigarettes  . Smokeless tobacco: Never Used  Substance Use Topics  . Alcohol use: No  . Drug use: No    Types: Marijuana     Allergies   Ritalin [methylphenidate hcl]   Review of Systems Review of Systems  Constitutional: Negative for fever.  Respiratory: Positive for cough and shortness of breath.    Physical Exam Triage Vital Signs ED Triage Vitals  Enc Vitals Group     BP 08/14/18 1333 (!) 167/97     Pulse Rate 08/14/18 1333 66     Resp 08/14/18 1333 18     Temp 08/14/18 1333 98.5 F (36.9 C)     Temp Source 08/14/18 1333 Oral     SpO2 08/14/18 1333 97 %     Weight --      Height  --      Head Circumference --      Peak Flow --      Pain Score 08/14/18 1334 10     Pain Loc --      Pain Edu? --      Excl. in GC? --    Updated Vital Signs BP (!) 167/97 (BP Location: Left Arm)   Pulse 66   Temp 98.5 F (36.9 C) (Oral)   Resp 18   LMP 08/13/2018   SpO2 97%   Visual Acuity Right Eye Distance:   Left Eye Distance:   Bilateral Distance:    Right Eye Near:   Left Eye Near:    Bilateral Near:     Physical Exam Vitals signs and nursing note reviewed.  Constitutional:      General: She is not in acute distress.    Appearance: Normal appearance.  HENT:     Head: Normocephalic and atraumatic.     Right Ear: Tympanic membrane normal.     Left Ear: Tympanic membrane normal.     Mouth/Throat:     Pharynx: Oropharynx is clear. Posterior oropharyngeal erythema present. No oropharyngeal exudate.  Cardiovascular:     Rate and Rhythm: Normal rate and regular rhythm.  Pulmonary:     Effort: Pulmonary effort is normal.     Breath sounds: Normal breath sounds.  Neurological:     Mental Status: She is alert.  Psychiatric:        Mood and Affect: Mood normal.        Behavior: Behavior normal.    UC Treatments / Results  Labs (all labs ordered are listed, but only abnormal results are displayed) Labs Reviewed - No data to display  EKG None  Radiology No results found.  Procedures Procedures (including critical care time)  Medications Ordered in UC Medications - No data to display  Initial Impression / Assessment and Plan / UC Course  I have reviewed the triage vital signs and the nursing notes.  Pertinent labs & imaging results that were available during my care of the patient were reviewed by me and considered in my medical decision making (see chart for details).    28 year old female presents with a viral respiratory infection.  Exam unremarkable. Albuterol and Tessalon Perles as directed.  Supportive care.  Work note given.  Final Clinical  Impressions(s) / UC Diagnoses   Final diagnoses:  Viral respiratory infection     Discharge Instructions     Rest. Fluids.  Medication as prescribed.  Take care  Dr. Adriana Simas    ED Prescriptions    Medication Sig Dispense Auth. Provider   albuterol (PROVENTIL HFA;VENTOLIN HFA) 108 (90 Base) MCG/ACT inhaler Inhale 1-2 puffs into the lungs every 6 (six) hours as needed for wheezing or shortness of breath. 1 Inhaler Donny Heffern G, DO   benzonatate (TESSALON) 200 MG capsule Take 1 capsule (200 mg total) by mouth 3 (three) times daily as needed for cough. 30 capsule Tommie Sams, DO     Controlled Substance Prescriptions [place] Controlled Substance Registry consulted? Not Applicable   Tommie Sams, DO 08/14/18 1346

## 2018-08-14 NOTE — Discharge Instructions (Signed)
Rest. Fluids.  Medication as prescribed.   Take care  Dr. Marvelyn Bouchillon  

## 2018-08-14 NOTE — ED Triage Notes (Signed)
Pt c/o congestion with productive cough, SOB and diarrhea x3days

## 2018-08-24 ENCOUNTER — Ambulatory Visit
Admission: EM | Admit: 2018-08-24 | Discharge: 2018-08-24 | Disposition: A | Discharge status: Home / Self Care | Payer: Self-pay | Attending: Family Medicine | Admitting: Family Medicine

## 2018-08-24 ENCOUNTER — Encounter: Payer: Self-pay | Admitting: Emergency Medicine

## 2018-08-24 DIAGNOSIS — B349 Viral infection, unspecified: Secondary | ICD-10-CM

## 2018-08-24 MED ORDER — LOPERAMIDE HCL 2 MG PO CAPS: 2.0000 mg | ORAL_CAPSULE | Freq: Four times a day (QID) | ORAL | 0 refills | Status: DC | PRN

## 2018-08-24 MED ORDER — GUAIFENESIN-CODEINE 100-10 MG/5ML PO SOLN: 5.0000 mL | ORAL | 0 refills | Status: DC | PRN

## 2018-08-24 NOTE — ED Triage Notes (Signed)
Pt presents to [facility] for assessment of cough, congestion, and diarrhea, continuing since her visit from last week.  States she was sent home from work today for continued sickness.

## 2018-08-24 NOTE — ED Notes (Signed)
Patient able to ambulate independently  

## 2018-08-24 NOTE — Discharge Instructions (Addendum)
Drink plenty of fluids When you can tolerate them, start bland foods Avoid spicy foods and fried food for at least a week Take Imodium as directed for diarrhea May take cough medicine as needed Expect improvement over the next couple of days

## 2018-08-24 NOTE — ED Provider Notes (Signed)
[facility]    CSN: [id number] Arrival date & time: 08/24/18  1137     History   Chief Complaint Chief Complaint  Patient presents with  . URI    HPI Sarah Small is a 28 y.o. female.   HPI  Sick for about 14 days now with mild cough, runny nose, cold symptoms.  Also has diarrhea many times a day and does not feel like she can eat anything because "it goes straight through me". States she is able to keep down fluids.  States anything she eats causes watery diarrhea.  She goes several times a day.  She has reduced her intake, and has poor appetite, because of the diarrhea.  No blood in the bowels.  No recent travel.  No new foods.  No recent antibiotics.  She has not tried any medication for diarrhea.  History reviewed. No pertinent past medical history.  There are no active problems to display for this patient.   History reviewed. No pertinent surgical history.  OB History   No obstetric history on file.      Home Medications    Prior to Admission medications   Medication Sig Start Date End Date Taking? Authorizing Provider  albuterol (PROVENTIL HFA;VENTOLIN HFA) 108 (90 Base) MCG/ACT inhaler Inhale 1-2 puffs into the lungs every 6 (six) hours as needed for wheezing or shortness of breath. 08/14/18  Yes Cook, Jayce G, DO  benzonatate (TESSALON) 200 MG capsule Take 1 capsule (200 mg total) by mouth 3 (three) times daily as needed for cough. 08/14/18  Yes Cook, Jayce G, DO  guaiFENesin-codeine 100-10 MG/5ML syrup Take 5 mLs by mouth every 4 (four) hours as needed for cough. 08/24/18   Eustace Moore, MD  loperamide (IMODIUM) 2 MG capsule Take 1 capsule (2 mg total) by mouth 4 (four) times daily as needed for diarrhea or loose stools. 08/24/18   Eustace Moore, MD    Family History Family History  Problem Relation Age of Onset  . Hypertension Mother   . Heart failure Mother     Social History Social History   Tobacco Use  . Smoking status:  Current Every Day Smoker    Packs/day: 0.25    Years: 9.00    Pack years: 2.25    Types: Cigarettes  . Smokeless tobacco: Never Used  Substance Use Topics  . Alcohol use: No  . Drug use: No    Types: Marijuana     Allergies   Ritalin [methylphenidate hcl]   Review of Systems Review of Systems  Constitutional: Positive for fatigue. Negative for chills and fever.  HENT: Positive for postnasal drip, rhinorrhea and sore throat. Negative for ear pain.   Eyes: Negative for pain and visual disturbance.  Respiratory: Positive for cough. Negative for shortness of breath.   Cardiovascular: Negative for chest pain and palpitations.  Gastrointestinal: Positive for diarrhea. Negative for abdominal pain, blood in stool and vomiting.  Genitourinary: Negative for dysuria and hematuria.  Musculoskeletal: Negative for arthralgias and back pain.  Skin: Negative for color change and rash.  Neurological: Negative for seizures and syncope.  All other systems reviewed and are negative.    Physical Exam Triage Vital Signs ED Triage Vitals  Enc Vitals Group     BP 08/24/18 1141 (!) 147/99     Pulse Rate 08/24/18 1141 63     Resp 08/24/18 1141 (!) 22     Temp 08/24/18 1141 97.9 F (36.6 C)  Temp Source 08/24/18 1141 Oral     SpO2 08/24/18 1141 97 %   No data found.  Updated Vital Signs BP (!) 147/99 (BP Location: Left Arm)   Pulse 63   Temp 97.9 F (36.6 C) (Oral)   Resp (!) 22   LMP 08/24/2018   SpO2 97%      Physical Exam Constitutional:      General: She is not in acute distress.    Appearance: She is well-developed.  HENT:     Head: Normocephalic and atraumatic.     Right Ear: Tympanic membrane and ear canal normal.     Left Ear: Tympanic membrane and ear canal normal.     Nose: Nose normal.     Mouth/Throat:     Mouth: Mucous membranes are moist.  Eyes:     Conjunctiva/sclera: Conjunctivae normal.     Pupils: Pupils are equal, round, and reactive to light.  Neck:       Musculoskeletal: Normal range of motion.  Cardiovascular:     Rate and Rhythm: Normal rate and regular rhythm.     Heart sounds: Normal heart sounds.  Pulmonary:     Effort: Pulmonary effort is normal. No respiratory distress.     Breath sounds: Normal breath sounds.  Abdominal:     General: There is no distension.     Palpations: Abdomen is soft.     Tenderness: There is no abdominal tenderness.     Comments: Active bowel sounds.  Nontender  Musculoskeletal: Normal range of motion.  Skin:    General: Skin is warm and dry.  Neurological:     Mental Status: She is alert.      UC Treatments / Results  Labs (all labs ordered are listed, but only abnormal results are displayed) Labs Reviewed - No data to display  EKG None  Radiology No results found.  Procedures Procedures (including critical care time)  Medications Ordered in UC Medications - No data to display  Initial Impression / Assessment and Plan / UC Course  I have reviewed the triage vital signs and the nursing notes.  Pertinent labs & imaging results that were available during my care of the patient were reviewed by me and considered in my medical decision making (see chart for details).     Reviewed symtomatic care.  Reason to return Final Clinical Impressions(s) / UC Diagnoses   Final diagnoses:  Viral illness     Discharge Instructions     Drink plenty of fluids When you can tolerate them, start bland foods Avoid spicy foods and fried food for at least a week Take Imodium as directed for diarrhea May take cough medicine as needed Expect improvement over the next couple of days    ED Prescriptions    Medication Sig Dispense Auth. Provider   guaiFENesin-codeine 100-10 MG/5ML syrup Take 5 mLs by mouth every 4 (four) hours as needed for cough. 236 mL Eustace Moore, MD   loperamide (IMODIUM) 2 MG capsule Take 1 capsule (2 mg total) by mouth 4 (four) times daily as needed for diarrhea or  loose stools. 12 capsule Eustace Moore, MD     Controlled Substance Prescriptions [place] Controlled Substance Registry consulted? Yes, I have consulted the [place] Controlled Substances Registry for this patient, and feel the risk/benefit ratio today is favorable for proceeding with this prescription for a controlled substance.   Eustace Moore, MD 08/24/18 [phone]

## 2018-09-16 IMAGING — DX DG CHEST 2V
2 series · 2 of 2 positions shown · non-contrast
Comparison: None.

CLINICAL DATA: Chest pain

EXAM:
CHEST  2 VIEW

[chest pa]
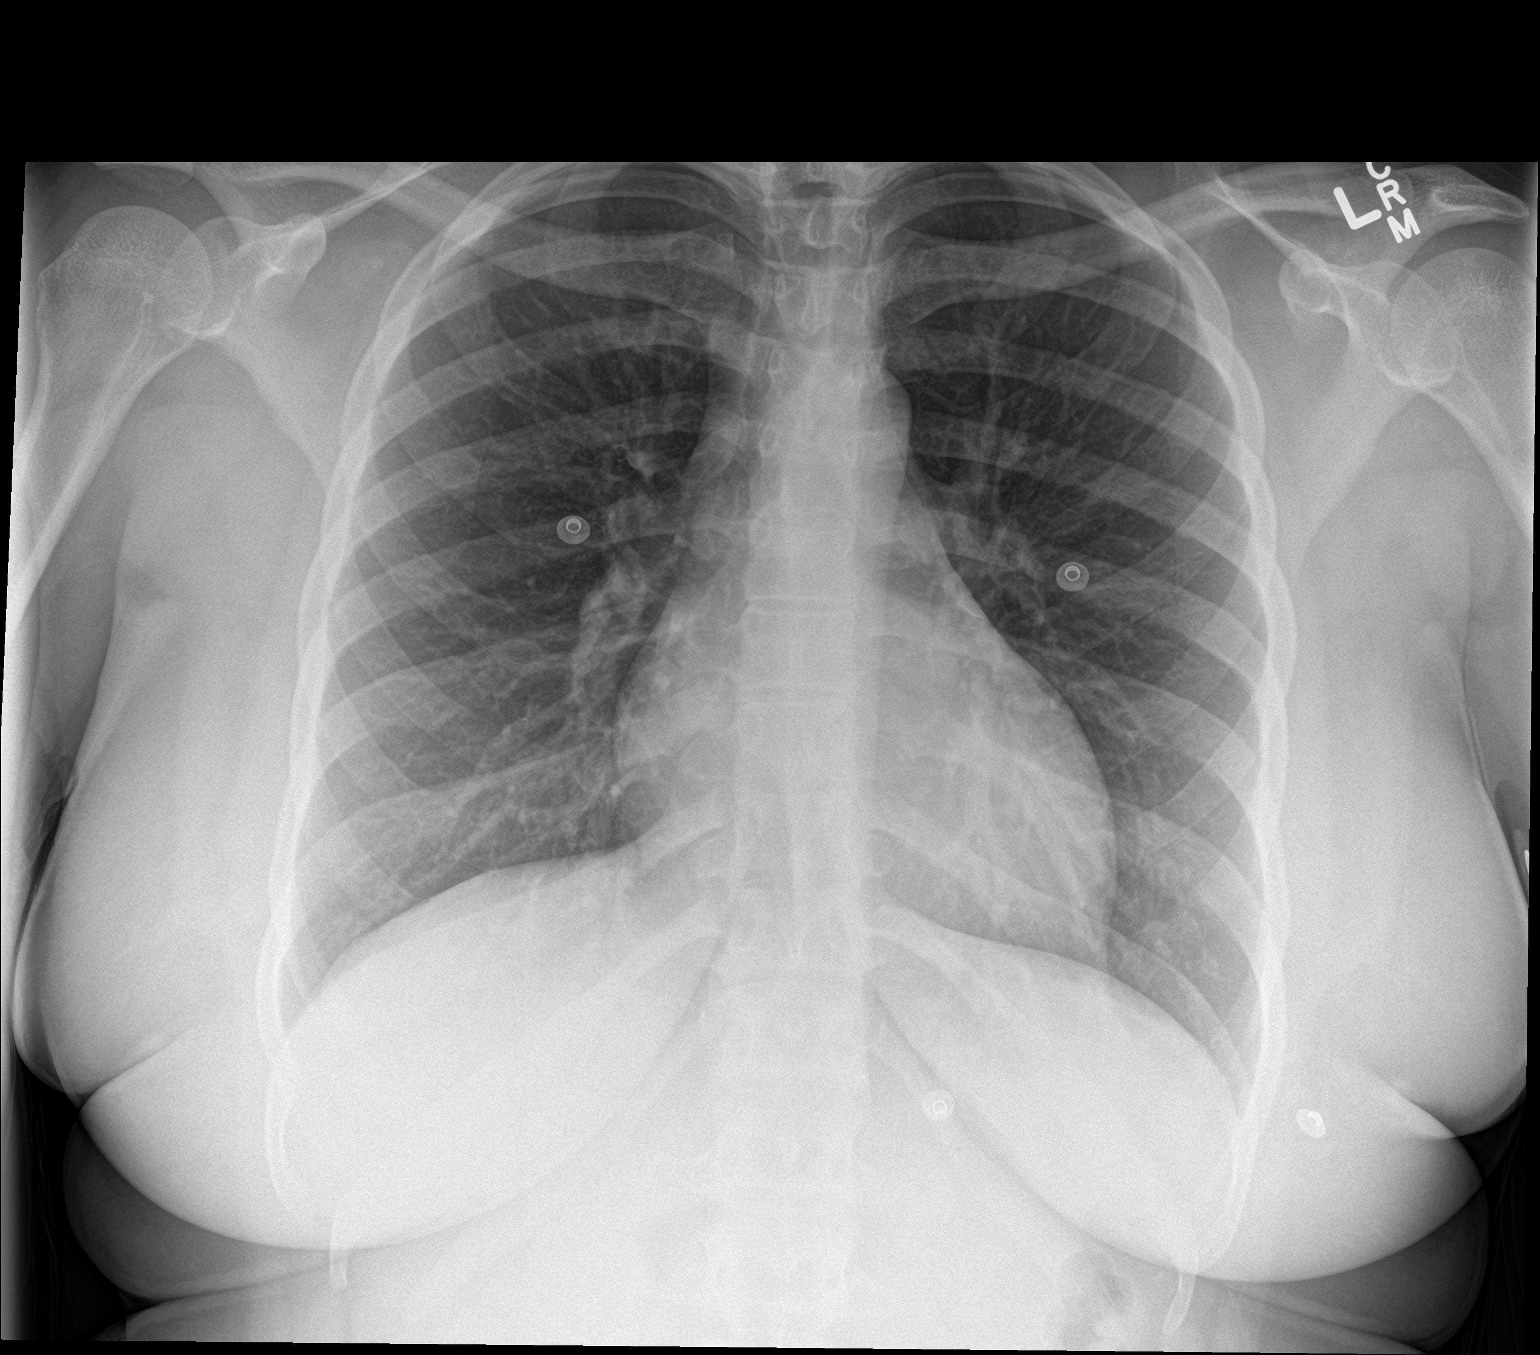

[chest lat]
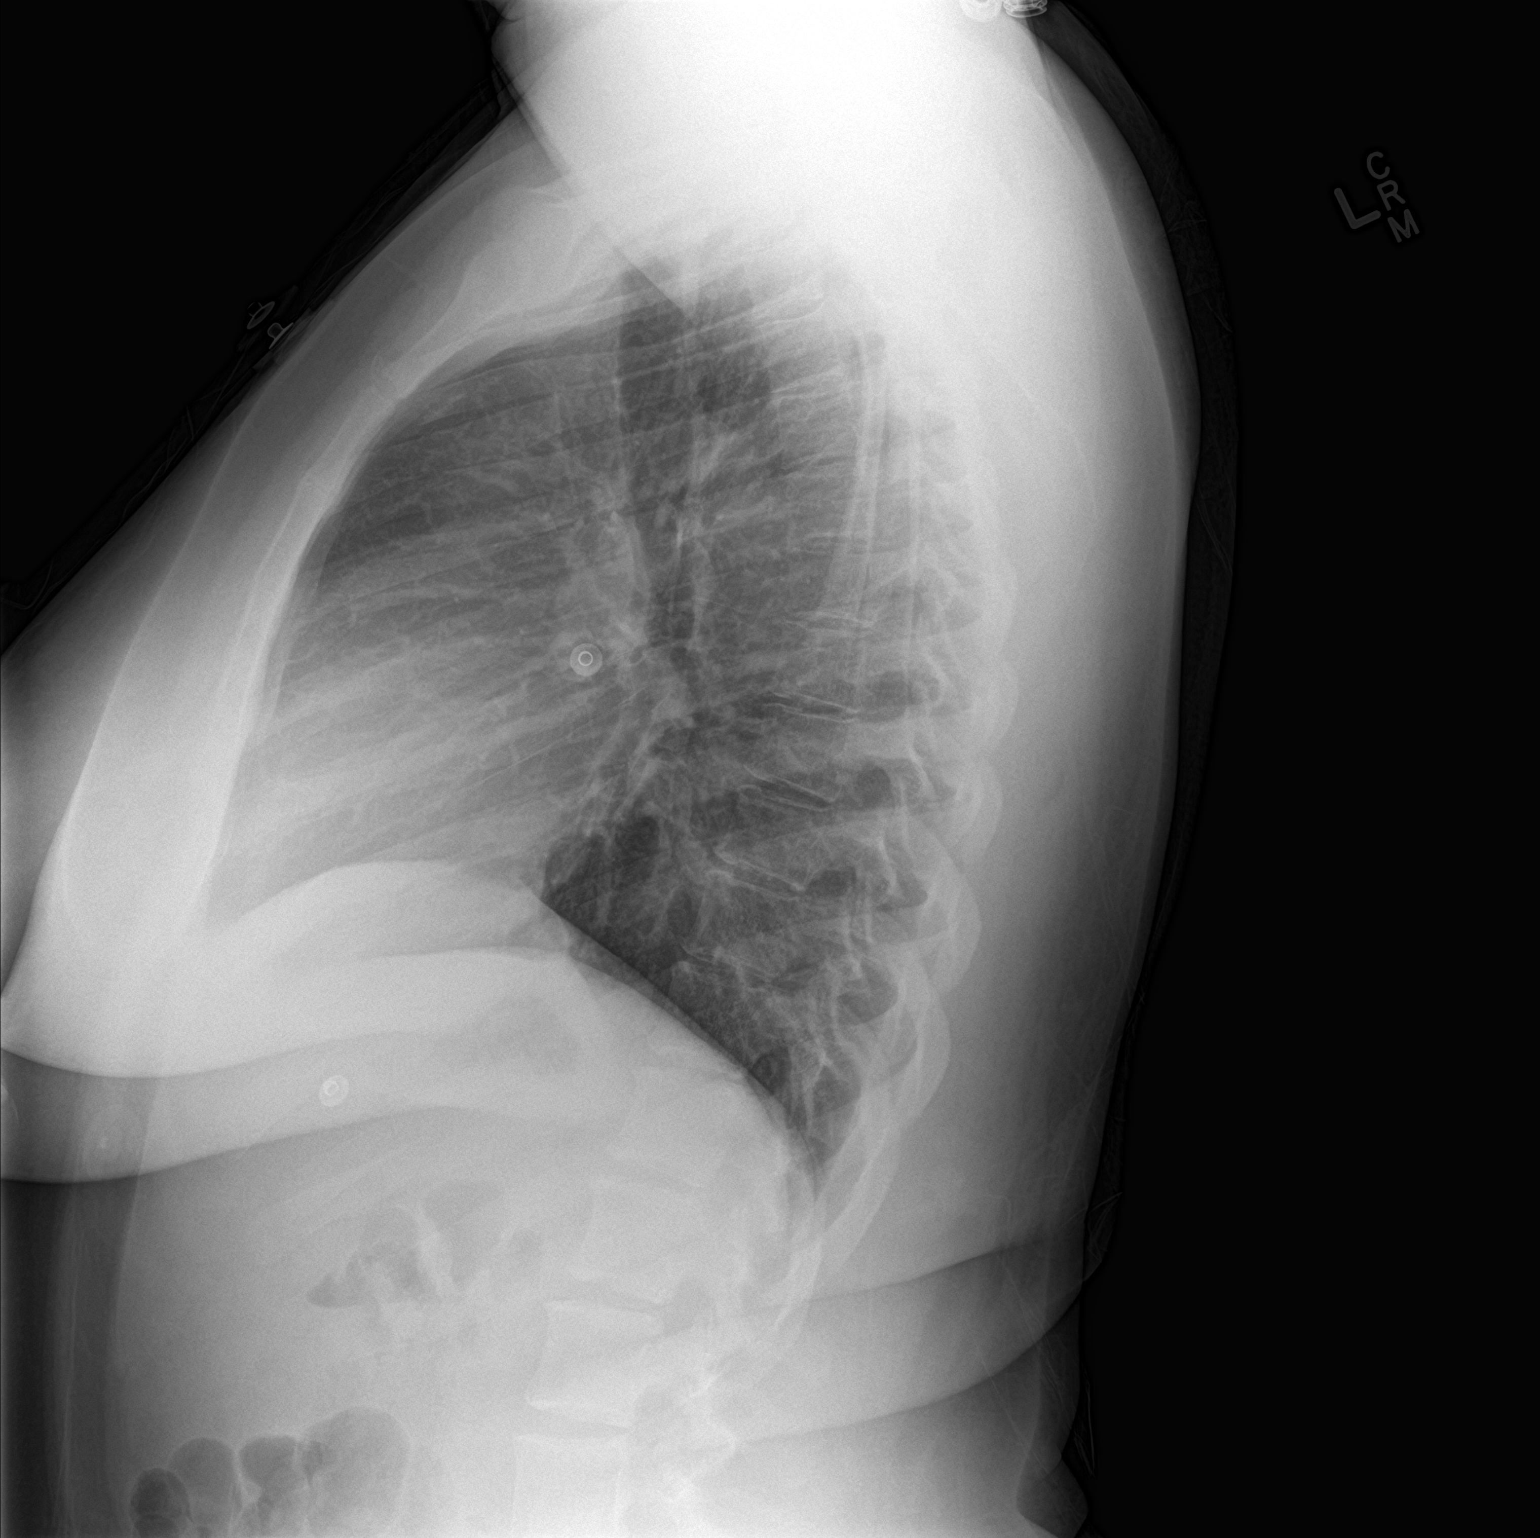

[2 of 2 positions shown; findings below may reference images not displayed]

FINDINGS: Normal heart size. Normal mediastinal contour. No pneumothorax. No
pleural effusion. Lungs appear clear, with no acute consolidative
airspace disease and no pulmonary edema.
IMPRESSION: No active cardiopulmonary disease.

## 2019-04-05 ENCOUNTER — Emergency Department (HOSPITAL_COMMUNITY): Payer: No Typology Code available for payment source

## 2019-04-05 ENCOUNTER — Encounter (HOSPITAL_COMMUNITY): Payer: Self-pay

## 2019-04-05 ENCOUNTER — Other Ambulatory Visit: Payer: Self-pay

## 2019-04-05 ENCOUNTER — Emergency Department (HOSPITAL_COMMUNITY)
Admission: EM | Admit: 2019-04-05 | Discharge: 2019-04-05 | Disposition: A | Discharge status: Home / Self Care | Payer: No Typology Code available for payment source | Attending: Emergency Medicine | Admitting: Emergency Medicine

## 2019-04-05 DIAGNOSIS — S80212A Abrasion, left knee, initial encounter: Secondary | ICD-10-CM | POA: Diagnosis not present

## 2019-04-05 DIAGNOSIS — F17210 Nicotine dependence, cigarettes, uncomplicated: Secondary | ICD-10-CM | POA: Insufficient documentation

## 2019-04-05 DIAGNOSIS — M25531 Pain in right wrist: Secondary | ICD-10-CM | POA: Diagnosis not present

## 2019-04-05 DIAGNOSIS — Y9355 Activity, bike riding: Secondary | ICD-10-CM | POA: Diagnosis not present

## 2019-04-05 DIAGNOSIS — S50312A Abrasion of left elbow, initial encounter: Secondary | ICD-10-CM | POA: Insufficient documentation

## 2019-04-05 DIAGNOSIS — S59902A Unspecified injury of left elbow, initial encounter: Secondary | ICD-10-CM | POA: Diagnosis present

## 2019-04-05 DIAGNOSIS — Z23 Encounter for immunization: Secondary | ICD-10-CM | POA: Diagnosis not present

## 2019-04-05 DIAGNOSIS — Y998 Other external cause status: Secondary | ICD-10-CM | POA: Insufficient documentation

## 2019-04-05 DIAGNOSIS — Y92410 Unspecified street and highway as the place of occurrence of the external cause: Secondary | ICD-10-CM | POA: Insufficient documentation

## 2019-04-05 DIAGNOSIS — M25331 Other instability, right wrist: Secondary | ICD-10-CM | POA: Diagnosis not present

## 2019-04-05 DIAGNOSIS — T148XXA Other injury of unspecified body region, initial encounter: Secondary | ICD-10-CM

## 2019-04-05 MED ORDER — TETANUS-DIPHTH-ACELL PERTUSSIS 5-2.5-18.5 LF-MCG/0.5 IM SUSP
0.5000 mL | Freq: Once | INTRAMUSCULAR | Status: AC
  Administered 2019-04-05: 21:00:00 0.5 mL via INTRAMUSCULAR
  Filled 2019-04-05: qty 0.5
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
## 2019-04-05 NOTE — Discharge Instructions (Addendum)
There is some concern for a scaphoid fracture which is a small bone in your wrist.  I placed you in a splint.  Please try to use this as much as possible.  Follow-up with orthopedics as you will need repeat imaging.  May take Tylenol and ibuprofen as needed for pain.  I have also given you Robaxin as needed which is a muscle relaxer for muscle pain which she will likely have.  This is typically worse on days 2 and day 3.  Make sure to clean your wound daily.  May place Neosporin or bacitracin ointment to these areas.  Your tetanus vaccine was updated while here in the emergency department.  Follow-up if you have any new or worsening symptoms.

## 2020-01-17 ENCOUNTER — Other Ambulatory Visit: Payer: Self-pay

## 2020-01-17 ENCOUNTER — Ambulatory Visit: Payer: 59 | Admitting: *Deleted

## 2020-01-17 ENCOUNTER — Ambulatory Visit: Payer: 59 | Attending: Critical Care Medicine

## 2020-01-17 ENCOUNTER — Other Ambulatory Visit: Payer: Self-pay | Admitting: Critical Care Medicine

## 2020-01-17 ENCOUNTER — Ambulatory Visit: Payer: 59 | Admitting: Critical Care Medicine

## 2020-01-17 VITALS — BP 145/91 | HR 77 | Temp 98.7°F | Resp 18 | Ht 64.0 in | Wt 199.0 lb

## 2020-01-17 DIAGNOSIS — I10 Essential (primary) hypertension: Secondary | ICD-10-CM | POA: Diagnosis not present

## 2020-01-17 DIAGNOSIS — Z23 Encounter for immunization: Secondary | ICD-10-CM

## 2020-01-17 MED ORDER — AMLODIPINE BESYLATE 10 MG PO TABS: 10.0000 mg | ORAL_TABLET | Freq: Every day | ORAL | 3 refills | Status: DC

## 2020-01-17 NOTE — Progress Notes (Signed)
Subjective:    Patient ID: Sarah Small, female    DOB: 06-17-1991, 29 y.o.   MRN: [id number]  29 y.o.F who is seen today accompanied by her partner for evaluation of blood pressure.  The patient does not have a current primary care provider but has been diagnosed with elevation in blood pressure readings in the past.  She has been concerned about her blood pressure and that when it was checked previously at a drugstore it was in the 155/80 range.  She complains of progression of headaches over the past 2 years.  When she goes to donate blood at a plasma center also her blood pressures been checked to be elevated at 155/80.  She notes a sharp pain in her head but denies dizziness or eye changes.  She has no chest pain or shortness of breath.  She does have occasional slight edema in the feet.  She denies any wheezing or cough.  She does occasionally snore at night according to her partner.  She notes belching and burping and occasional reflux symptoms.  She smokes 6 cigarettes daily but does not drink alcohol or other substances.  There is no other past medical history that is documented.   History reviewed. No pertinent past medical history.   Family History  Problem Relation Age of Onset   Hypertension Mother    Heart failure Mother      Social History   Socioeconomic History   Marital status: Single    Spouse name: Not on file   Number of children: Not on file   Years of education: Not on file   Highest education level: Not on file  Occupational History   Not on file  Tobacco Use   Smoking status: Current Every Day Smoker    Packs/day: 0.25    Years: 9.00    Pack years: 2.25    Types: Cigarettes   Smokeless tobacco: Never Used  Substance and Sexual Activity   Alcohol use: No   Drug use: No    Types: Marijuana   Sexual activity: Yes  Other Topics Concern   Not on file  Social History Narrative   ** Merged History Encounter **       Social Determinants  of Health   Financial Resource Strain:    Difficulty of Paying Living Expenses:   Food Insecurity:    Worried About Programme researcher, broadcasting/film/video in the Last Year:    Barista in the Last Year:   Transportation Needs:    Freight forwarder (Medical):    Lack of Transportation (Non-Medical):   Physical Activity:    Days of Exercise per Week:    Minutes of Exercise per Session:   Stress:    Feeling of Stress :   Social Connections:    Frequency of Communication with Friends and Family:    Frequency of Social Gatherings with Friends and Family:    Attends Religious Services:    Active Member of Clubs or Organizations:    Attends Banker Meetings:    Marital Status:   Intimate Partner Violence:    Fear of Current or Ex-Partner:    Emotionally Abused:    Physically Abused:    Sexually Abused:      Allergies  Allergen Reactions   Ritalin [Methylphenidate Hcl] Rash     Outpatient Medications Prior to Visit  Medication Sig Dispense Refill   albuterol (PROVENTIL HFA;VENTOLIN HFA) 108 (90 Base) MCG/ACT inhaler Inhale 1-2 puffs  into the lungs every 6 (six) hours as needed for wheezing or shortness of breath. 1 Inhaler 0   benzonatate (TESSALON) 200 MG capsule Take 1 capsule (200 mg total) by mouth 3 (three) times daily as needed for cough. 30 capsule 0   guaiFENesin-codeine 100-10 MG/5ML syrup Take 5 mLs by mouth every 4 (four) hours as needed for cough. 236 mL 0   loperamide (IMODIUM) 2 MG capsule Take 1 capsule (2 mg total) by mouth 4 (four) times daily as needed for diarrhea or loose stools. 12 capsule 0   No facility-administered medications prior to visit.       Review of Systems  Constitutional: Negative.   HENT: Negative.   Eyes: Negative.   Respiratory: Negative.   Cardiovascular: Positive for leg swelling. Negative for chest pain and palpitations.  Gastrointestinal: Negative for abdominal distention, abdominal pain, anal  bleeding, blood in stool, constipation, diarrhea, nausea and vomiting.       Gerd  Endocrine: Negative.   Genitourinary: Negative.   Musculoskeletal: Negative.   Neurological: Positive for headaches. Negative for dizziness and light-headedness.  Psychiatric/Behavioral: Negative.        Objective:   Physical Exam Vitals:   01/17/20 1558  BP: (!) 145/91  Pulse: 77  Resp: 18  Temp: 98.7 F (37.1 C)  TempSrc: Oral  SpO2: 100%  Weight: 199 lb (90.3 kg)  Height: 5\' 4"  (1.626 m)    Gen: Pleasant, well-nourished, in no distress,  normal affect  ENT: No lesions,  mouth clear,  oropharynx clear, no postnasal drip  Neck: No JVD, no TMG, no carotid bruits  Lungs: No use of accessory muscles, no dullness to percussion, clear without rales or rhonchi  Cardiovascular: RRR, heart sounds normal, no murmur or gallops, no peripheral edema  Abdomen: soft and NT, no HSM,  BS normal  Musculoskeletal: No deformities, no cyanosis or clubbing  Neuro: alert, non focal  Skin: Warm, no lesions or rashes         Assessment & Plan:  I personally reviewed all images and lab data in the [facility] system as well as any outside material available during this office visit and agree with the  radiology impressions.   Essential hypertension Persistent elevations in blood pressure readings compatible with hypertension  Plan will be to begin amlodipine 10 mg daily and obtain a complete blood count pleat metabolic panel thyroid panel  Patient be given a follow-up visit with me in 3 weeks in the [facility]   Sarah Small was seen today for blood pressure check.  Diagnoses and all orders for this visit:  Essential hypertension -     Comprehensive metabolic panel -     CBC with Differential/Platelet; Future -     Thyroid Panel With TSH -     CBC with Differential/Platelet  Other orders -     amLODipine (NORVASC) 10 MG tablet; Take 1 tablet (10 mg total) by mouth  daily.   Also the patient agreed to the Covid vaccine we gave this patient a maternal vaccine at this visit for her first in a series of 2 vaccines

## 2020-01-17 NOTE — Patient Instructions (Signed)
A moderna covid vaccine was given,  Please return to our mobile unit in one month per your appointment given today  Start amlodipine one daily  Labs today : metabolic panel, thyroid panel, blood counts  Return to see Dr Delford Field in three weeks at [facility] wellness   Follow diet below for your high blood pressure  Focus on reducing cigarette use   Managing Your Hypertension Hypertension is commonly called high blood pressure. This is when the force of your blood pressing against the walls of your arteries is too strong. Arteries are blood vessels that carry blood from your heart throughout your body. Hypertension forces the heart to work harder to pump blood, and may cause the arteries to become narrow or stiff. Having untreated or uncontrolled hypertension can cause heart attack, stroke, kidney disease, and other problems. What are blood pressure readings? A blood pressure reading consists of a higher number over a lower number. Ideally, your blood pressure should be below 120/80. The first ("top") number is called the systolic pressure. It is a measure of the pressure in your arteries as your heart beats. The second ("bottom") number is called the diastolic pressure. It is a measure of the pressure in your arteries as the heart relaxes. What does my blood pressure reading mean? Blood pressure is classified into four stages. Based on your blood pressure reading, your health care provider may use the following stages to determine what type of treatment you need, if any. Systolic pressure and diastolic pressure are measured in a unit called mm Hg. Normal  Systolic pressure: below 120.  Diastolic pressure: below 80. Elevated  Systolic pressure: 120-129.  Diastolic pressure: below 80. Hypertension stage 1  Systolic pressure: 130-139.  Diastolic pressure: 80-89. Hypertension stage 2  Systolic pressure: 140 or above.  Diastolic pressure: 90 or above. What health risks are  associated with hypertension? Managing your hypertension is an important responsibility. Uncontrolled hypertension can lead to:  A heart attack.  A stroke.  A weakened blood vessel (aneurysm).  Heart failure.  Kidney damage.  Eye damage.  Metabolic syndrome.  Memory and concentration problems. What changes can I make to manage my hypertension? Hypertension can be managed by making lifestyle changes and possibly by taking medicines. Your health care provider will help you make a plan to bring your blood pressure within a normal range. Eating and drinking   Eat a diet that is high in fiber and potassium, and low in salt (sodium), added sugar, and fat. An example eating plan is called the DASH (Dietary Approaches to Stop Hypertension) diet. To eat this way: ? Eat plenty of fresh fruits and vegetables. Try to fill half of your plate at each meal with fruits and vegetables. ? Eat whole grains, such as whole wheat pasta, brown rice, or whole grain bread. Fill about one quarter of your plate with whole grains. ? Eat low-fat diary products. ? Avoid fatty cuts of meat, processed or cured meats, and poultry with skin. Fill about one quarter of your plate with lean proteins such as fish, chicken without skin, beans, eggs, and tofu. ? Avoid premade and processed foods. These tend to be higher in sodium, added sugar, and fat.  Reduce your daily sodium intake. Most people with hypertension should eat less than 1,500 mg of sodium a day.  Limit alcohol intake to no more than 1 drink a day for nonpregnant women and 2 drinks a day for men. One drink equals 12 oz of beer, 5  oz of wine, or 1 oz of hard liquor. Lifestyle  Work with your health care provider to maintain a healthy body weight, or to lose weight. Ask what an ideal weight is for you.  Get at least 30 minutes of exercise that causes your heart to beat faster (aerobic exercise) most days of the week. Activities may include walking,  swimming, or biking.  Include exercise to strengthen your muscles (resistance exercise), such as weight lifting, as part of your weekly exercise routine. Try to do these types of exercises for 30 minutes at least 3 days a week.  Do not use any products that contain nicotine or tobacco, such as cigarettes and e-cigarettes. If you need help quitting, ask your health care provider.  Control any long-term (chronic) conditions you have, such as high cholesterol or diabetes. Monitoring  Monitor your blood pressure at home as told by your health care provider. Your personal target blood pressure may vary depending on your medical conditions, your age, and other factors.  Have your blood pressure checked regularly, as often as told by your health care provider. Working with your health care provider  Review all the medicines you take with your health care provider because there may be side effects or interactions.  Talk with your health care provider about your diet, exercise habits, and other lifestyle factors that may be contributing to hypertension.  Visit your health care provider regularly. Your health care provider can help you create and adjust your plan for managing hypertension. Will I need medicine to control my blood pressure? Your health care provider may prescribe medicine if lifestyle changes are not enough to get your blood pressure under control, and if:  Your systolic blood pressure is 130 or higher.  Your diastolic blood pressure is 80 or higher. Take medicines only as told by your health care provider. Follow the directions carefully. Blood pressure medicines must be taken as prescribed. The medicine does not work as well when you skip doses. Skipping doses also puts you at risk for problems. Contact a health care provider if:  You think you are having a reaction to medicines you have taken.  You have repeated (recurrent) headaches.  You feel dizzy.  You have swelling in  your ankles.  You have trouble with your vision. Get help right away if:  You develop a severe headache or confusion.  You have unusual weakness or numbness, or you feel faint.  You have severe pain in your chest or abdomen.  You vomit repeatedly.  You have trouble breathing. Summary  Hypertension is when the force of blood pumping through your arteries is too strong. If this condition is not controlled, it may put you at risk for serious complications.  Your personal target blood pressure may vary depending on your medical conditions, your age, and other factors. For most people, a normal blood pressure is less than 120/80.  Hypertension is managed by lifestyle changes, medicines, or both. Lifestyle changes include weight loss, eating a healthy, low-sodium diet, exercising more, and limiting alcohol. This information is not intended to replace advice given to you by your health care provider. Make sure you discuss any questions you have with your health care provider. Document Revised: 09/25/2018 Document Reviewed: 05/01/2016 Elsevier Patient Education  2020 [employer].  Smoking Tobacco Information, Adult Smoking tobacco can be harmful to your health. Tobacco contains a poisonous (toxic), colorless chemical called nicotine. Nicotine is addictive. It changes the brain and can make it hard to stop  smoking. Tobacco also has other toxic chemicals that can hurt your body and raise your risk of many cancers. How can smoking tobacco affect me? Smoking tobacco puts you at risk for:  Cancer. Smoking is most commonly associated with lung cancer, but can also lead to cancer in other parts of the body.  Chronic obstructive pulmonary disease (COPD). This is a long-term lung condition that makes it hard to breathe. It also gets worse over time.  High blood pressure (hypertension), heart disease, stroke, or heart attack.  Lung infections, such as pneumonia.  Cataracts. This is when the  lenses in the eyes become clouded.  Digestive problems. This may include peptic ulcers, heartburn, and gastroesophageal reflux disease (GERD).  Oral health problems, such as gum disease and tooth loss.  Loss of taste and smell. Smoking can affect your appearance by causing:  Wrinkles.  Yellow or stained teeth, fingers, and fingernails. Smoking tobacco can also affect your social life, because:  It may be challenging to find places to smoke when away from home. Many workplaces, [employer], hotels, and public places are tobacco-free.  Smoking is expensive. This is due to the cost of tobacco and the long-term costs of treating health problems from smoking.  Secondhand smoke may affect those around you. Secondhand smoke can cause lung cancer, breathing problems, and heart disease. Children of smokers have a higher risk for: ? Sudden infant death syndrome (SIDS). ? Ear infections. ? Lung infections. If you currently smoke tobacco, quitting now can help you:  Lead a longer and healthier life.  Look, smell, breathe, and feel better over time.  Save money.  Protect others from the harms of secondhand smoke. What actions can I take to prevent health problems? Quit smoking   Do not start smoking. Quit if you already do.  Make a plan to quit smoking and commit to it. Look for programs to help you and ask your health care provider for recommendations and ideas.  Set a date and write down all the reasons you want to quit.  Let your friends and family know you are quitting so they can help and support you. Consider finding friends who also want to quit. It can be easier to quit with someone else, so that you can support each other.  Talk with your health care provider about using nicotine replacement medicines to help you quit, such as gum, lozenges, patches, sprays, or pills.  Do not replace cigarette smoking with electronic cigarettes, which are commonly called e-cigarettes. The  safety of e-cigarettes is not known, and some may contain harmful chemicals.  If you try to quit but return to smoking, stay positive. It is common to slip up when you first quit, so take it one day at a time.  Be prepared for cravings. When you feel the urge to smoke, chew gum or suck on hard candy. Lifestyle  Stay busy and take care of your body.  Drink enough fluid to keep your urine pale yellow.  Get plenty of exercise and eat a healthy diet. This can help prevent weight gain after quitting.  Monitor your eating habits. Quitting smoking can cause you to have a larger appetite than when you smoke.  Find ways to relax. Go out with friends or family to a movie or a restaurant where people do not smoke.  Ask your health care provider about having regular tests (screenings) to check for cancer. This may include blood tests, imaging tests, and other tests.  Find ways to  manage your stress, such as meditation, yoga, or exercise. Where to find support To get support to quit smoking, consider:  Asking your health care provider for more information and resources.  Taking classes to learn more about quitting smoking.  Looking for local organizations that offer resources about quitting smoking.  Joining a support group for people who want to quit smoking in your local community.  Calling the smokefree.gov counselor helpline: 1-800-Quit-Now (305) 108-1328(1-(615)752-7652) Where to find more information You may find more information about quitting smoking from:  HelpGuide.org: www.helpguide.org  BankRights.uySmokefree.gov: smokefree.gov  American Lung Association: www.lung.org Contact a health care provider if you:  Have problems breathing.  Notice that your lips, nose, or fingers turn blue.  Have chest pain.  Are coughing up blood.  Feel faint or you pass out.  Have other health changes that cause you to worry. Summary  Smoking tobacco can negatively affect your health, the health of those around  you, your finances, and your social life.  Do not start smoking. Quit if you already do. If you need help quitting, ask your health care provider.  Think about joining a support group for people who want to quit smoking in your local community. There are many effective programs that will help you to quit this behavior. This information is not intended to replace advice given to you by your health care provider. Make sure you discuss any questions you have with your health care provider. Document Revised: 02/26/2019 Document Reviewed: 06/18/2016 Elsevier Patient Education  2020 [employer]

## 2020-01-17 NOTE — Assessment & Plan Note (Signed)
Persistent elevations in blood pressure readings compatible with hypertension  Plan will be to begin amlodipine 10 mg daily and obtain a complete blood count pleat metabolic panel thyroid panel  Patient be given a follow-up visit with me in 3 weeks in the [facility]

## 2020-01-18 LAB — CBC WITH DIFFERENTIAL/PLATELET
Basophils Absolute: 0.1 10*3/uL (ref 0.0–0.2)
Basos: 1 %
EOS (ABSOLUTE): 0.1 10*3/uL (ref 0.0–0.4)
Eos: 1 %
Hematocrit: 37.1 % (ref 34.0–46.6)
Hemoglobin: 11.3 g/dL (ref 11.1–15.9)
Immature Grans (Abs): 0 10*3/uL (ref 0.0–0.1)
Immature Granulocytes: 0 %
Lymphocytes Absolute: 3.9 10*3/uL — ABNORMAL HIGH (ref 0.7–3.1)
Lymphs: 36 %
MCH: 24.4 pg — ABNORMAL LOW (ref 26.6–33.0)
MCHC: 30.5 g/dL — ABNORMAL LOW (ref 31.5–35.7)
MCV: 80 fL (ref 79–97)
Monocytes Absolute: 0.6 10*3/uL (ref 0.1–0.9)
Monocytes: 6 %
Neutrophils Absolute: 6.1 10*3/uL (ref 1.4–7.0)
Neutrophils: 56 %
Platelets: 257 10*3/uL (ref 150–450)
RBC: 4.64 x10E6/uL (ref 3.77–5.28)
RDW: 14.4 % (ref 11.7–15.4)
WBC: 10.9 10*3/uL — ABNORMAL HIGH (ref 3.4–10.8)

## 2020-01-18 LAB — COMPREHENSIVE METABOLIC PANEL
ALT: 13 IU/L (ref 0–32)
AST: 16 IU/L (ref 0–40)
Albumin/Globulin Ratio: 1.7 (ref 1.2–2.2)
Albumin: 3.9 g/dL (ref 3.9–5.0)
Alkaline Phosphatase: 85 IU/L (ref 48–121)
BUN/Creatinine Ratio: 12 (ref 9–23)
BUN: 10 mg/dL (ref 6–20)
Bilirubin Total: <0.2 mg/dL (ref 0.0–1.2)
CO2: 22 mmol/L (ref 20–29)
Calcium: 8.9 mg/dL (ref 8.7–10.2)
Chloride: 105 mmol/L (ref 96–106)
Creatinine, Ser: 0.82 mg/dL (ref 0.57–1.00)
GFR calc Af Amer: 112 mL/min/{1.73_m2} (ref >59)
GFR calc non Af Amer: 97 mL/min/{1.73_m2} (ref >59)
Globulin, Total: 2.3 g/dL (ref 1.5–4.5)
Glucose: 86 mg/dL (ref 65–99)
Potassium: 3.8 mmol/L (ref 3.5–5.2)
Sodium: 138 mmol/L (ref 134–144)
Total Protein: 6.2 g/dL (ref 6.0–8.5)

## 2020-01-18 LAB — THYROID PANEL WITH TSH
Free Thyroxine Index: 1.7 (ref 1.2–4.9)
T3 Uptake Ratio: 22 % — ABNORMAL LOW (ref 24–39)
T4, Total: 7.8 ug/dL (ref 4.5–12.0)
TSH: 3.05 u[IU]/mL (ref 0.450–4.500)

## 2020-01-18 MED FILL — AMLODIPINE BESYLATE 10 MG T: 10 | 30 days supply | Qty: 30 | Fill #0

## 2020-01-18 NOTE — Progress Notes (Signed)
   Covid-19 Vaccination Clinic  Name:  Sarah Small    MRN: [id number] DOB: Mar 05, 1991  01/18/2020  [name] was observed post Covid-19 immunization for 15 mintues without incident. She was provided with Vaccine Information Sheet and instruction to access the V-Safe system.   [name] was instructed to call 911 with any severe reactions post vaccine: Marland Kitchen Difficulty breathing  . Swelling of face and throat  . A fast heartbeat  . A bad rash all over body  . Dizziness and weakness   Immunizations Administered    Name Date Dose VIS Date Route   Moderna COVID-19 Vaccine 01/17/2020  4:40 PM 0.5 mL 05/2019 Intramuscular   Manufacturer: Moderna   Lot: [id number]   NDC: 91694-503-88

## 2020-01-20 ENCOUNTER — Telehealth: Payer: Self-pay | Admitting: *Deleted

## 2020-01-20 NOTE — Telephone Encounter (Signed)
.  Medical Assistant left message on patient's home and cell voicemail. Voicemail states to give a call back to Dajiah Kooi with [facility] [phone] 

## 2020-01-20 NOTE — Telephone Encounter (Signed)
-----   Message from Storm Frisk, MD sent at 01/18/2020  9:59 AM EDT ----- Can you let the patient know her blood counts, kidney and liver function normal.  No diabetes.  Thyroid is normal

## 2020-02-16 NOTE — Progress Notes (Signed)
Patient tolerated injection well today 

## 2020-02-22 ENCOUNTER — Other Ambulatory Visit: Payer: Self-pay | Admitting: Critical Care Medicine

## 2020-02-22 MED FILL — AMLODIPINE BESYLATE 10 MG T: 10 | 30 days supply | Qty: 30 | Fill #1

## 2020-02-22 NOTE — Telephone Encounter (Signed)
Medication Refill - Medication: amlodipine   Has the patient contacted their pharmacy? Yes.   (Agent: If no, request that the patient contact the pharmacy for the refill.) (Agent: If yes, when and what did the pharmacy advise?)  Preferred Pharmacy (with phone number or street name):  [facility] - [place], [place] - [address]  [address] [place] [address]  Phone: [phone] Fax: [phone]  Hours: Not open 24 hours     Agent: Please be advised that RX refills may take up to 3 business days. We ask that you follow-up with your pharmacy.

## 2020-02-24 ENCOUNTER — Encounter: Payer: Self-pay | Admitting: Critical Care Medicine

## 2020-02-24 ENCOUNTER — Ambulatory Visit: Payer: 59 | Attending: Critical Care Medicine | Admitting: Critical Care Medicine

## 2020-02-24 ENCOUNTER — Other Ambulatory Visit: Payer: Self-pay

## 2020-02-24 VITALS — BP 126/85 | HR 67 | Temp 98.3°F | Resp 16 | Ht 63.0 in | Wt 197.0 lb

## 2020-02-24 DIAGNOSIS — F17200 Nicotine dependence, unspecified, uncomplicated: Secondary | ICD-10-CM

## 2020-02-24 DIAGNOSIS — Z1159 Encounter for screening for other viral diseases: Secondary | ICD-10-CM

## 2020-02-24 DIAGNOSIS — I10 Essential (primary) hypertension: Secondary | ICD-10-CM

## 2020-02-24 DIAGNOSIS — Z114 Encounter for screening for human immunodeficiency virus [HIV]: Secondary | ICD-10-CM

## 2020-02-24 MED ORDER — AMLODIPINE BESYLATE 10 MG PO TABS: 10.0000 mg | ORAL_TABLET | Freq: Every day | ORAL | 3 refills | Status: DC

## 2020-02-24 NOTE — Assessment & Plan Note (Signed)
Patient improved we will maintain amlodipine at current dose level without change

## 2020-02-24 NOTE — Progress Notes (Signed)
Subjective:    Patient ID: Sarah Small, female    DOB: [birth year], 29 y.o.   MRN: [id number]  01/17/20 29 y.o.F who is seen today accompanied by her partner for evaluation of blood pressure.  The patient does not have a current primary care provider but has been diagnosed with elevation in blood pressure readings in the past.  She has been concerned about her blood pressure and that when it was checked previously at a drugstore it was in the 155/80 range.  She complains of progression of headaches over the past 2 years.  When she goes to donate blood at a plasma center also her blood pressures been checked to be elevated at 155/80.  She notes a sharp pain in her head but denies dizziness or eye changes.  She has no chest pain or shortness of breath.  She does have occasional slight edema in the feet.  She denies any wheezing or cough.  She does occasionally snore at night according to her partner.  She notes belching and burping and occasional reflux symptoms.  She smokes 6 cigarettes daily but does not drink alcohol or other substances.  There is no other past medical history that is documented.  02/24/2020 Patient returns in follow-up has received the first of her Materna vaccines has another one follow-up in a week.  She complains of some dryness in the hands and feet and she works as a IT trainer.  She is due a Pap smear.  She does have increased anxiety symptoms and scores high today on her GAD-7 score scale.  Blood pressure on arrival 126/85 she is still on the amlodipine  History reviewed. No pertinent past medical history.   Family History  Problem Relation Age of Onset  . Hypertension Mother   . Heart failure Mother      Social History   Socioeconomic History  . Marital status: Single    Spouse name: Not on file  . Number of children: Not on file  . Years of education: Not on file  . Highest education level: Not on file  Occupational History  . Not on  file  Tobacco Use  . Smoking status: Current Every Day Smoker    Packs/day: 0.25    Years: 9.00    Pack years: 2.25    Types: Cigarettes  . Smokeless tobacco: Never Used  Substance and Sexual Activity  . Alcohol use: No  . Drug use: Yes    Types: Marijuana    Comment: twice a week   . Sexual activity: Yes  Other Topics Concern  . Not on file  Social History Narrative   ** Merged History Encounter **       Social Determinants of Health   Financial Resource Strain:   . Difficulty of Paying Living Expenses: Not on file  Food Insecurity:   . Worried About Programme researcher, broadcasting/film/video in the Last Year: Not on file  . Ran Out of Food in the Last Year: Not on file  Transportation Needs:   . Lack of Transportation (Medical): Not on file  . Lack of Transportation (Non-Medical): Not on file  Physical Activity:   . Days of Exercise per Week: Not on file  . Minutes of Exercise per Session: Not on file  Stress:   . Feeling of Stress : Not on file  Social Connections:   . Frequency of Communication with Friends and Family: Not on file  . Frequency of Social Gatherings with  Friends and Family: Not on file  . Attends Religious Services: Not on file  . Active Member of Clubs or Organizations: Not on file  . Attends Banker Meetings: Not on file  . Marital Status: Not on file  Intimate Partner Violence:   . Fear of Current or Ex-Partner: Not on file  . Emotionally Abused: Not on file  . Physically Abused: Not on file  . Sexually Abused: Not on file     Allergies  Allergen Reactions  . Ritalin [Methylphenidate Hcl] Rash     Outpatient Medications Prior to Visit  Medication Sig Dispense Refill  . amLODipine (NORVASC) 10 MG tablet Take 1 tablet (10 mg total) by mouth daily. 30 tablet 3   No facility-administered medications prior to visit.       Review of Systems  Constitutional: Negative.   HENT: Negative.   Eyes: Negative.   Respiratory: Negative.    Cardiovascular: Negative for chest pain, palpitations and leg swelling.  Gastrointestinal: Negative for abdominal distention, abdominal pain, anal bleeding, blood in stool, constipation, diarrhea, nausea and vomiting.       Gerd  Endocrine: Negative.   Genitourinary: Negative.   Musculoskeletal: Negative.   Neurological: Negative for dizziness, light-headedness and headaches.  Psychiatric/Behavioral: Negative.        Objective:   Physical Exam  Vitals:   02/24/20 0857  BP: 126/85  Pulse: 67  Resp: 16  Temp: 98.3 F (36.8 C)  SpO2: 98%  Weight: 197 lb (89.4 kg)  Height: 5\' 3"  (1.6 m)    Gen: Pleasant, well-nourished, in no distress,  normal affect  ENT: No lesions,  mouth clear,  oropharynx clear, no postnasal drip  Neck: No JVD, no TMG, no carotid bruits  Lungs: No use of accessory muscles, no dullness to percussion, clear without rales or rhonchi  Cardiovascular: RRR, heart sounds normal, no murmur or gallops, no peripheral edema  Abdomen: soft and NT, no HSM,  BS normal  Musculoskeletal: No deformities, no cyanosis or clubbing  Neuro: alert, non focal  Skin: Warm, no lesions or rashes         Assessment & Plan:  I personally reviewed all images and lab data in the [facility] system as well as any outside material available during this office visit and agree with the  radiology impressions.   Essential hypertension Patient improved we will maintain amlodipine at current dose level without change  Tobacco dependency    . Current smoking consumption amount: 1/4 pack a day of cigarettes  . Dicsussion on advise to quit smoking and smoking impacts: Cardiovascular health impacts  . Patient's willingness to quit: Willing to consider quitting  . Methods to quit smoking discussed: Behavioral modification  . Medication management of smoking session drugs discussed: Nicotine replacement therapy  . Resources provided:  AVS   . Setting quit date not  determined  . Follow-up arranged 2 months   Time spent counseling the patient: 5 minutes    Diagnoses and all orders for this visit:  Need for hepatitis C screening test -     HCV Ab w/Rflx to Verification  Tobacco dependency  Encounter for screening for HIV -     HIV Antibody (routine testing w rflx)  Essential hypertension  Other orders -     Discontinue: amLODipine (NORVASC) 10 MG tablet; Take 1 tablet (10 mg total) by mouth daily. -     amLODipine (NORVASC) 10 MG tablet; Take 1 tablet (10 mg total) by mouth daily.  The patient is already received her Covid vaccine previously she will receive another vaccine this week  We will screen for HIV and hepatitis C and refer for Pap smear

## 2020-02-24 NOTE — Progress Notes (Signed)
Here to establish care /  HX of anxiety

## 2020-02-24 NOTE — Patient Instructions (Addendum)
A pap smear will be obtained with one of our providers  Stay on amlodipine , a 90day refill [facility] to [facility]  Focus on more exercise and reduce tobacco to no further smoking   HIV and Hep C test obtained  Moisturize your hands and feet   Follow healthy diet below  An appointment with Jenel Lucks will be made for your anxiety  Return Dr Delford Field in 4 months   Tobacco Use Disorder Tobacco use disorder (TUD) occurs when a person craves, seeks, and uses tobacco, regardless of the consequences. This disorder can cause problems with mental and physical health. It can affect your ability to have healthy relationships, and it can keep you from meeting your responsibilities at work, home, or school. Tobacco may be:  Smoked as a cigarette or cigar.  Inhaled using e-cigarettes.  Smoked in a pipe or hookah.  Chewed as smokeless tobacco.  Inhaled into the nostrils as snuff. Tobacco products contain a dangerous chemical called nicotine, which is very addictive. Nicotine triggers hormones that make the body feel stimulated and works on areas of the brain that make you feel good. These effects can make it hard for people to quit nicotine. Tobacco contains many other unsafe chemicals that can damage almost every organ in the body. Smoking tobacco also puts others in danger due to fire risk and possible health problems caused by breathing in secondhand smoke. What are the signs or symptoms? Symptoms of TUD may include:  Being unable to slow down or stop your tobacco use.  Spending an abnormal amount of time getting or using tobacco.  Craving tobacco. Cravings may last for up to 6 months after quitting.  Tobacco use that: ? Interferes with your work, school, or home life. ? Interferes with your personal and social relationships. ? Makes you give up activities that you once enjoyed or found important.  Using tobacco even though you know that it is: ? Dangerous or  bad for your health or someone else's health. ? Causing problems in your life.  Needing more and more of the substance to get the same effect (developing tolerance).  Experiencing unpleasant symptoms if you do not use the substance (withdrawal). Withdrawal symptoms may include: ? Depressed, anxious, or irritable mood. ? Difficulty concentrating. ? Increased appetite. ? Restlessness or trouble sleeping.  Using the substance to avoid withdrawal. How is this diagnosed? This condition may be diagnosed based on:  Your current and past tobacco use. Your health care provider may ask questions about how your tobacco use affects your life.  A physical exam. You may be diagnosed with TUD if you have at least two symptoms within a 52-month period. How is this treated? This condition is treated by stopping tobacco use. Many people are unable to quit on their own and need help. Treatment may include:  Nicotine replacement therapy (NRT). NRT provides nicotine without the other harmful chemicals in tobacco. NRT gradually lowers the dosage of nicotine in the body and reduces withdrawal symptoms. NRT is available as: ? Over-the-counter gums, lozenges, and skin patches. ? Prescription mouth inhalers and nasal sprays.  Medicine that acts on the brain to reduce cravings and withdrawal symptoms.  A type of talk therapy that examines your triggers for tobacco use, how to avoid them, and how to cope with cravings (behavioral therapy).  Hypnosis. This may help with withdrawal symptoms.  Joining a support group for others coping with TUD. The best treatment for TUD is usually a combination  of medicine, talk therapy, and support groups. Recovery can be a long process. Many people start using tobacco again after stopping (relapse). If you relapse, it does not mean that treatment will not work. Follow these instructions at home:  Lifestyle  Do not use any products that contain nicotine or tobacco, such as  cigarettes and e-cigarettes.  Avoid things that trigger tobacco use as much as you can. Triggers include people and situations that usually cause you to use tobacco.  Avoid drinks that contain caffeine, including coffee. These may worsen some withdrawal symptoms.  Find ways to manage stress. Wanting to smoke may cause stress, and stress can make you want to smoke. Relaxation techniques such as deep breathing, meditation, and yoga may help.  Attend support groups as needed. These groups are an important part of long-term recovery for many people. General instructions  Take over-the-counter and prescription medicines only as told by your health care provider.  Check with your health care provider before taking any new prescription or over-the-counter medicines.  Decide on a friend, family member, or smoking quit-line (such as 1-800-QUIT-NOW in the [place] that you can call or text when you feel the urge to smoke or when you need help coping with cravings.  Keep all follow-up visits as told by your health care provider and therapist. This is important. Contact a health care provider if:  You are not able to take your medicines as prescribed.  Your symptoms get worse, even with treatment. Summary  Tobacco use disorder (TUD) occurs when a person craves, seeks, and uses tobacco regardless of the consequences.  This condition may be diagnosed based on your current and past tobacco use and a physical exam.  Many people are unable to quit on their own and need help. Recovery can be a long process.  The most effective treatment for TUD is usually a combination of medicine, talk therapy, and support groups. This information is not intended to replace advice given to you by your health care provider. Make sure you discuss any questions you have with your health care provider. Document Revised: 05/21/2017 Document Reviewed: 05/21/2017 Elsevier Patient Education  2020 [employer]  Hypertension, Adult Hypertension is another name for high blood pressure. High blood pressure forces your heart to work harder to pump blood. This can cause problems over time. There are two numbers in a blood pressure reading. There is a top number (systolic) over a bottom number (diastolic). It is best to have a blood pressure that is below 120/80. Healthy choices can help lower your blood pressure, or you may need medicine to help lower it. What are the causes? The cause of this condition is not known. Some conditions may be related to high blood pressure. What increases the risk?  Smoking.  Having type 2 diabetes mellitus, high cholesterol, or both.  Not getting enough exercise or physical activity.  Being overweight.  Having too much fat, sugar, calories, or salt (sodium) in your diet.  Drinking too much alcohol.  Having long-term (chronic) kidney disease.  Having a family history of high blood pressure.  Age. Risk increases with age.  Race. You may be at higher risk if you are African American.  Gender. Men are at higher risk than women before age 10345. After age 29, women are at higher risk than men.  Having obstructive sleep apnea.  Stress. What are the signs or symptoms?  High blood pressure may not cause symptoms. Very high blood pressure (hypertensive crisis) may  cause: ? Headache. ? Feelings of worry or nervousness (anxiety). ? Shortness of breath. ? Nosebleed. ? A feeling of being sick to your stomach (nausea). ? Throwing up (vomiting). ? Changes in how you see. ? Very bad chest pain. ? Seizures. How is this treated?  This condition is treated by making healthy lifestyle changes, such as: ? Eating healthy foods. ? Exercising more. ? Drinking less alcohol.  Your health care provider may prescribe medicine if lifestyle changes are not enough to get your blood pressure under control, and if: ? Your top number is above 130. ? Your bottom number is  above 80.  Your personal target blood pressure may vary. Follow these instructions at home: Eating and drinking   If told, follow the DASH eating plan. To follow this plan: ? Fill one half of your plate at each meal with fruits and vegetables. ? Fill one fourth of your plate at each meal with whole grains. Whole grains include whole-wheat pasta, brown rice, and whole-grain bread. ? Eat or drink low-fat dairy products, such as skim milk or low-fat yogurt. ? Fill one fourth of your plate at each meal with low-fat (lean) proteins. Low-fat proteins include fish, chicken without skin, eggs, beans, and tofu. ? Avoid fatty meat, cured and processed meat, or chicken with skin. ? Avoid pre-made or processed food.  Eat less than 1,500 mg of salt each day.  Do not drink alcohol if: ? Your doctor tells you not to drink. ? You are pregnant, may be pregnant, or are planning to become pregnant.  If you drink alcohol: ? Limit how much you use to:  0-1 drink a day for women.  0-2 drinks a day for men. ? Be aware of how much alcohol is in your drink. In the [place] one drink equals one 12 oz bottle of beer (355 mL), one 5 oz glass of wine (148 mL), or one 1 oz glass of hard liquor (44 mL). Lifestyle   Work with your doctor to stay at a healthy weight or to lose weight. Ask your doctor what the best weight is for you.  Get at least 30 minutes of exercise most days of the week. This may include walking, swimming, or biking.  Get at least 30 minutes of exercise that strengthens your muscles (resistance exercise) at least 3 days a week. This may include lifting weights or doing Pilates.  Do not use any products that contain nicotine or tobacco, such as cigarettes, e-cigarettes, and chewing tobacco. If you need help quitting, ask your doctor.  Check your blood pressure at home as told by your doctor.  Keep all follow-up visits as told by your doctor. This is important. Medicines  Take  over-the-counter and prescription medicines only as told by your doctor. Follow directions carefully.  Do not skip doses of blood pressure medicine. The medicine does not work as well if you skip doses. Skipping doses also puts you at risk for problems.  Ask your doctor about side effects or reactions to medicines that you should watch for. Contact a doctor if you:  Think you are having a reaction to the medicine you are taking.  Have headaches that keep coming back (recurring).  Feel dizzy.  Have swelling in your ankles.  Have trouble with your vision. Get help right away if you:  Get a very bad headache.  Start to feel mixed up (confused).  Feel weak or numb.  Feel faint.  Have very bad pain in your: ?  Chest. ? Belly (abdomen).  Throw up more than once.  Have trouble breathing. Summary  Hypertension is another name for high blood pressure.  High blood pressure forces your heart to work harder to pump blood.  For most people, a normal blood pressure is less than 120/80.  Making healthy choices can help lower blood pressure. If your blood pressure does not get lower with healthy choices, you may need to take medicine. This information is not intended to replace advice given to you by your health care provider. Make sure you discuss any questions you have with your health care provider. Document Revised: 02/11/2018 Document Reviewed: 02/11/2018 Elsevier Patient Education  2020 [employer].

## 2020-02-24 NOTE — Assessment & Plan Note (Signed)
  .   Current smoking consumption amount: 1/4 pack a day of cigarettes  . Dicsussion on advise to quit smoking and smoking impacts: Cardiovascular health impacts  . Patient's willingness to quit: Willing to consider quitting  . Methods to quit smoking discussed: Behavioral modification  . Medication management of smoking session drugs discussed: Nicotine replacement therapy  . Resources provided:  AVS   . Setting quit date not determined  . Follow-up arranged 2 months   Time spent counseling the patient: 5 minutes

## 2020-02-25 ENCOUNTER — Ambulatory Visit: Payer: 59 | Admitting: Internal Medicine

## 2020-02-25 ENCOUNTER — Telehealth: Payer: Self-pay | Admitting: Critical Care Medicine

## 2020-02-25 LAB — HCV INTERPRETATION

## 2020-02-25 LAB — HIV ANTIBODY (ROUTINE TESTING W REFLEX): HIV Screen 4th Generation wRfx: NONREACTIVE

## 2020-02-25 LAB — HCV AB W/RFLX TO VERIFICATION: HCV Ab: <0.1 s/co ratio (ref 0.0–0.9)

## 2020-02-25 NOTE — Telephone Encounter (Signed)
Patient stated that she was just scheduled for an appt. For a PAP for 9/10 at at 8:30 this morning, today.  She stated that there is a mistake because that time has already passed.  Please call patient to reschedule to get this appt. Correct.  CB# [phone]

## 2020-03-01 ENCOUNTER — Encounter: Payer: Self-pay | Admitting: Emergency Medicine

## 2020-03-01 ENCOUNTER — Ambulatory Visit
Admission: EM | Admit: 2020-03-01 | Discharge: 2020-03-01 | Disposition: A | Discharge status: Home / Self Care | Payer: 59 | Attending: Emergency Medicine | Admitting: Emergency Medicine

## 2020-03-01 ENCOUNTER — Other Ambulatory Visit: Payer: Self-pay

## 2020-03-01 DIAGNOSIS — J069 Acute upper respiratory infection, unspecified: Secondary | ICD-10-CM

## 2020-03-01 DIAGNOSIS — Z20822 Contact with and (suspected) exposure to covid-19: Secondary | ICD-10-CM

## 2020-03-01 HISTORY — DX: Essential (primary) hypertension: I10

## 2020-03-01 MED ORDER — FLUTICASONE PROPIONATE 50 MCG/ACT NA SUSP: 1.0000 | Freq: Every day | NASAL | 0 refills | Status: DC

## 2020-03-01 MED ORDER — CETIRIZINE HCL 10 MG PO TABS: 10.0000 mg | ORAL_TABLET | Freq: Every day | ORAL | 0 refills | Status: DC

## 2020-03-01 MED ORDER — ALBUTEROL SULFATE HFA 108 (90 BASE) MCG/ACT IN AERS: 2.0000 | INHALATION_SPRAY | RESPIRATORY_TRACT | 0 refills | Status: DC | PRN

## 2020-03-01 MED ORDER — BENZONATATE 100 MG PO CAPS: 100.0000 mg | ORAL_CAPSULE | Freq: Three times a day (TID) | ORAL | 0 refills | Status: DC

## 2020-03-01 NOTE — Discharge Instructions (Signed)

## 2020-03-01 NOTE — ED Triage Notes (Signed)
Started 2 days ago, runny nose and burning.  Sneezing. Coughing, nasal congestion and wheezing. No history of asthma. No fevers. Has been using cough syrup with no relief.

## 2020-03-01 NOTE — ED Provider Notes (Signed)
[facility]    CSN: [id number] Arrival date & time: 03/01/20  1443      History   Chief Complaint Chief Complaint  Patient presents with  . Nasal Congestion    HPI Sarah Small is a 29 y.o. female   Presenting for URI symptoms x2 days.  Patient provides history: Endorsing malaise, fatigue, runny nose, sneezing, dry cough, wheezing.  Has needed albuterol inhalers in the past for this despite lack of history of asthma.  Does endorse history of allergies.  Requesting inhaler.  No chest pain, palpitations, nausea or vomiting, diarrhea.  No known sick exposures.  Has taken cough medication without relief.  Past Medical History:  Diagnosis Date  . Hypertension     Patient Active Problem List   Diagnosis Date Noted  . Tobacco dependency 02/24/2020  . Essential hypertension 01/17/2020    History reviewed. No pertinent surgical history.  OB History   No obstetric history on file.      Home Medications    Prior to Admission medications   Medication Sig Start Date End Date Taking? Authorizing Provider  albuterol (VENTOLIN HFA) 108 (90 Base) MCG/ACT inhaler Inhale 2 puffs into the lungs every 4 (four) hours as needed for wheezing or shortness of breath. 03/01/20   Hall-Potvin, [place], PA-C  amLODipine (NORVASC) 10 MG tablet Take 1 tablet (10 mg total) by mouth daily. 02/24/20   Storm Frisk, MD  benzonatate (TESSALON) 100 MG capsule Take 1 capsule (100 mg total) by mouth every 8 (eight) hours. 03/01/20   Hall-Potvin, [place], PA-C  cetirizine (ZYRTEC ALLERGY) 10 MG tablet Take 1 tablet (10 mg total) by mouth daily. 03/01/20   Hall-Potvin, [place], PA-C  fluticasone (FLONASE) 50 MCG/ACT nasal spray Place 1 spray into both nostrils daily. 03/01/20   Hall-Potvin, [place], PA-C    Family History Family History  Problem Relation Age of Onset  . Hypertension Mother   . Heart failure Mother     Social History Social History   Tobacco Use  . Smoking  status: Current Every Day Smoker    Packs/day: 0.25    Years: 9.00    Pack years: 2.25    Types: Cigarettes  . Smokeless tobacco: Never Used  Substance Use Topics  . Alcohol use: No  . Drug use: Yes    Types: Marijuana    Comment: twice a week      Allergies   Ritalin [methylphenidate hcl]   Review of Systems As per HPI   Physical Exam Triage Vital Signs ED Triage Vitals  Enc Vitals Group     BP      Pulse      Resp      Temp      Temp src      SpO2      Weight      Height      Head Circumference      Peak Flow      Pain Score      Pain Loc      Pain Edu?      Excl. in GC?    No data found.  Updated Vital Signs BP (!) 148/89 (BP Location: Right Arm)   Pulse 71   Temp 98 F (36.7 C) (Oral)   Resp 18   Wt 197 lb (89.4 kg)   LMP 02/18/2020   SpO2 96%   BMI 34.90 kg/m   Visual Acuity Right Eye Distance:   Left Eye Distance:   Bilateral  Distance:    Right Eye Near:   Left Eye Near:    Bilateral Near:     Physical Exam Constitutional:      General: She is not in acute distress.    Appearance: She is obese. She is not ill-appearing or diaphoretic.  HENT:     Head: Normocephalic and atraumatic.     Mouth/Throat:     Mouth: Mucous membranes are moist.     Pharynx: Oropharynx is clear. No oropharyngeal exudate or posterior oropharyngeal erythema.  Eyes:     General: No scleral icterus.    Conjunctiva/sclera: Conjunctivae normal.     Pupils: Pupils are equal, round, and reactive to light.  Neck:     Comments: Trachea midline, negative JVD Cardiovascular:     Rate and Rhythm: Normal rate and regular rhythm.     Heart sounds: No murmur heard.  No gallop.   Pulmonary:     Effort: Pulmonary effort is normal. No respiratory distress.     Breath sounds: No wheezing, rhonchi or rales.  Musculoskeletal:     Cervical back: Neck supple. No tenderness.  Lymphadenopathy:     Cervical: No cervical adenopathy.  Skin:    Capillary Refill: Capillary  refill takes less than 2 seconds.     Coloration: Skin is not jaundiced or pale.     Findings: No rash.  Neurological:     General: No focal deficit present.     Mental Status: She is alert and oriented to person, place, and time.      UC Treatments / Results  Labs (all labs ordered are listed, but only abnormal results are displayed) Labs Reviewed  NOVEL CORONAVIRUS, NAA    EKG   Radiology No results found.  Procedures Procedures (including critical care time)  Medications Ordered in UC Medications - No data to display  Initial Impression / Assessment and Plan / UC Course  I have reviewed the triage vital signs and the nursing notes.  Pertinent labs & imaging results that were available during my care of the patient were reviewed by me and considered in my medical decision making (see chart for details).     Patient afebrile, nontoxic, with SpO2 96%.  Covid PCR pending.  Patient to quarantine until results are back.  We will treat supportively as outlined below.  Return precautions discussed, patient verbalized understanding and is agreeable to plan. Final Clinical Impressions(s) / UC Diagnoses   Final diagnoses:  Encounter for laboratory testing for COVID-19 virus  URI with cough and congestion     Discharge Instructions     Tessalon for cough. Start flonase, atrovent nasal spray for nasal congestion/drainage. You can use over the counter nasal saline rinse such as neti pot for nasal congestion. Keep hydrated, your urine should be clear to pale yellow in color. Tylenol/motrin for fever and pain. Monitor for any worsening of symptoms, chest pain, shortness of breath, wheezing, swelling of the throat, go to the emergency department for further evaluation needed.     ED Prescriptions    Medication Sig Dispense Auth. Provider   cetirizine (ZYRTEC ALLERGY) 10 MG tablet Take 1 tablet (10 mg total) by mouth daily. 30 tablet Hall-Potvin, [place], PA-C   fluticasone  (FLONASE) 50 MCG/ACT nasal spray Place 1 spray into both nostrils daily. 16 g Hall-Potvin, [place], PA-C   benzonatate (TESSALON) 100 MG capsule Take 1 capsule (100 mg total) by mouth every 8 (eight) hours. 21 capsule Hall-Potvin, [place], PA-C   albuterol (VENTOLIN HFA)  108 (90 Base) MCG/ACT inhaler Inhale 2 puffs into the lungs every 4 (four) hours as needed for wheezing or shortness of breath. 18 g Hall-Potvin, [place], PA-C     PDMP not reviewed this encounter.   Hall-Potvin, [place], [place] 03/01/20 1024

## 2020-03-04 LAB — SARS-COV-2, NAA 2 DAY TAT

## 2020-03-04 LAB — NOVEL CORONAVIRUS, NAA: SARS-CoV-2, NAA: NOT DETECTED

## 2020-03-08 ENCOUNTER — Other Ambulatory Visit: Payer: Self-pay

## 2020-03-08 ENCOUNTER — Other Ambulatory Visit (HOSPITAL_COMMUNITY)
Admission: RE | Admit: 2020-03-08 | Discharge: 2020-03-08 | Disposition: A | Discharge status: Home / Self Care | Payer: 59 | Source: Ambulatory Visit | Attending: Family Medicine | Admitting: Family Medicine

## 2020-03-08 ENCOUNTER — Encounter: Payer: Self-pay | Admitting: Family Medicine

## 2020-03-08 ENCOUNTER — Ambulatory Visit: Payer: 59 | Attending: Internal Medicine | Admitting: Family Medicine

## 2020-03-08 VITALS — BP 115/74 | HR 80 | Temp 98.7°F | Resp 16 | Wt 198.2 lb

## 2020-03-08 DIAGNOSIS — Z124 Encounter for screening for malignant neoplasm of cervix: Secondary | ICD-10-CM

## 2020-03-08 DIAGNOSIS — N898 Other specified noninflammatory disorders of vagina: Secondary | ICD-10-CM

## 2020-03-08 NOTE — Patient Instructions (Signed)
Cancer Screening for Women A cancer screening is a test or exam that checks for cancer. Your health care provider will recommend specific cancer screenings based on your age, personal history, and family history of cancer. Work with your health care provider to create a cancer screening schedule that protects your health. Why is cancer screening done? Cancer screening is done to look for cancer in the very early stages, before it spreads and becomes harder to treat and before you would start to notice symptoms. Finding cancer early improves the chances of successful treatment. It may save your life. Who should be screened for cancer? All women should be screened for certain cancers, including breast cancer, cervical cancer, and skin cancer. Your health care provider may recommend screenings for other types of cancer if:  You had cancer before.  You have a family member with cancer.  You have abnormal genes that could increase the risk of cancer.  You have risk factors for certain cancers, such as smoking. When you should be screened for cancer depends on:  Your age.  Your medical history and your family's medical history.  Certain lifestyle factors, such as smoking.  Environmental exposure, such as to asbestos. What are some common cancer screenings? Breast cancer Breast cancer screening is done with a test that takes images of breast tissue (mammogram). Here are some screening guidelines:  When you are age 40-44, you will be given the choice to start having mammograms.  When you are age 45-54, you should have a mammogram every year.  You may start having mammograms before age 45 if you have risk factors for breast cancer, such as having an immediate family member with breast cancer.  When you are age 55 or older, you should have a mammogram every 1-2 years for as long as you are in good health and have a life expectancy of 10 years or more.  It is important to know what your  breasts look and feel like so you can report any changes to your health care provider.  Cervical cancer Cervical cancer screening is done with a Pap test. This testchecks for abnormalities, including the virus that causes cervical cancer (human papillomavirus, or HPV). To perform the test, a health care provider takes a swab of cervical cells during a pelvic exam. Screening for cervical cancer with a Pap test should start at age 21. Here are some screening guidelines:  When you are age 21-29, you should have a Pap test every 3 years.  When you are age 30-65, you should have a Pap test and HPV test every 5 years or have a Pap test every 3 years.  You may be screened for cervical cancer more often if you have risk factors for cervical cancer.  If your Pap tests are abnormal, you may have an HPV test.  If you have had the HPV vaccine, you will still be screened for cervical cancer and follow normal screening recommendations. You do not need to be screened for cervical cancer if any of the following apply to you:  You are older than age 65 and you have not had a serious cervical precancer or cancer in the last 20 years.  Your cervix and uterus have been removed and you have never had cervical cancer or precancerous cells. Endometrial cancer There is no standard screening test for endometrial cancer, but the cancer can be detected with:  A test of a sample of tissue taken from the lining of the uterus (endometrial tissue   biopsy).  A vaginal ultrasound.  Pap tests. If you are at increased risk for endometrial cancer, you may need to have these tests more often than normal. You are at increased risk if:  You have a family history of ovarian, uterine, or colon cancer.  You are taking tamoxifen, a drug that is used to treat breast cancer.  You have certain types of colon cancer. If you have reached menopause, it is especially important to talk with your health care provider about any vaginal  bleeding or spotting. Screening for endometrial cancer is not recommended for women who do not have symptoms of the cancer, such as vaginal bleeding. Colorectal cancer  All adults should have screening for colorectal cancer starting at age 50 and continuing until age 75. Your health care provider may recommend screening at age 45. You will have tests every 1-10 years, depending on your results and the type of screening test. If you have a family history of colon or rectal cancer or other risk factors, you may need to start having screenings earlier. Talk with your health care provider about which screening test is right for you and how often you should be screened. Colorectal cancer screening looks for cancer or for growths called polyps that often form before cancer starts. Tests to look for cancer or polyps include:  Colonoscopy or flexible sigmoidoscopy. For these procedures, a flexible tube with a small camera is inserted into the rectum.  CT colonography. This test uses X-rays and a contrast dye to check the colon for polyps. If a polyp is found, you may need to have a colonoscopy so the polyp can be located and removed. Tests to look for cancer in the stool (feces) include:  Guaiac-based fecal occult blood test (FOBT). This test detects blood in stool. It can be done at home with a kit.  Fecal immunochemical test (FIT). This test detects blood in stool. For this test, you will need to collect stool samples at home.  Stool DNA test. This test looks for blood in stool and any changes in DNA that can lead to colon cancer. For this test, you will need to collect a stool sample at home and send it to a lab.  Skin cancer Skin cancer screening is done by checking the skin for unusual moles or spots and any changes in existing moles. Your health care provider should check your skin for signs of skin cancer at every physical exam. You should check your skin every month and tell your health care  provider right away if anything looks unusual. Women with a higher-than-normal risk for skin cancer may want to see a skin specialist (dermatologist) for an annual body check. Lung cancer Lung cancer screening is done with a CT scan that looks for abnormal cells in the lungs. Discuss lung cancer screening with your health care provider if you are 55-74 years old and if any of the following apply to you:  You currently smoke.  You used to smoke heavily.  You have a smoking history of 1 pack a day for 30 years or 2 packs a day for 15 years.  You have quit smoking within the past 15 years. If you smoke heavily or if you used to smoke, you may need to be screened every year. Where to find more information  National Cancer Institute: https://www.cancer.gov/about-cancer/screening  Centers for Disease Control and Prevention: https://www.cdc.gov/cancer/dcpc/prevention/screening.htm  Department of Health and Human Services: https://www.womenshealth.gov/screening-tests-and-vaccines/screening-tests-for-women Contact a health care provider if:  You have   concerns about any signs or symptoms of cancer, such as: ? Moles that have an unusual shape or color. ? Changes in existing moles. ? A sore on your skin that does not heal. ? Blood in your stool. ? Fatigue that does not go away. ? Frequent pain or cramping in your abdomen. ? Coughing, or coughing up blood. ? Losing weight without trying. ? Lumps or other changes in your breasts. ? Vaginal bleeding, spotting, or changes in your periods. Summary  Be aware of and watch for signs and symptoms of cancer, especially symptoms of breast cancer, cervical cancer, endometrial cancer, colorectal cancer, skin cancer, and lung cancer.  Early detection of cancer with cancer screening may save your life.  Talk with your health care provider about your specific cancer risks.  Work together with your health care provider to create a cancer screening plan  that is right for you. This information is not intended to replace advice given to you by your health care provider. Make sure you discuss any questions you have with your health care provider. Document Revised: 09/23/2018 Document Reviewed: 02/29/2016 Elsevier Patient Education  2020 Elsevier Inc.  

## 2020-03-08 NOTE — Progress Notes (Signed)
Established Patient Office Visit  Subjective:  Patient ID: Sarah Small, female    DOB: 17-Jun-1991  Age: 29 y.o. MRN: [id number]  CC:  Chief Complaint  Patient presents with  . Gynecologic Exam    HPI Sarah Small, 29 yo female, who presents for her pap smear as a screening test for cervical cancer. She does not feel that she is having any issues. Her menses are occurring regularly with last menses in the middle of last month. She has not had an y abdominal or pelvic pain. She has not noticed any vaginal discharge. She has not had a prior pap.   Past Medical History:  Diagnosis Date  . Hypertension     No past surgical history on file.  Family History  Problem Relation Age of Onset  . Hypertension Mother   . Heart failure Mother     Social History   Socioeconomic History  . Marital status: Single    Spouse name: Not on file  . Number of children: Not on file  . Years of education: Not on file  . Highest education level: Not on file  Occupational History  . Not on file  Tobacco Use  . Smoking status: Current Every Day Smoker    Packs/day: 0.25    Years: 9.00    Pack years: 2.25    Types: Cigarettes  . Smokeless tobacco: Never Used  Substance and Sexual Activity  . Alcohol use: No  . Drug use: Yes    Types: Marijuana    Comment: twice a week   . Sexual activity: Yes  Other Topics Concern  . Not on file  Social History Narrative   ** Merged History Encounter **       Social Determinants of Health   Financial Resource Strain:   . Difficulty of Paying Living Expenses: Not on file  Food Insecurity:   . Worried About Programme researcher, broadcasting/film/video in the Last Year: Not on file  . Ran Out of Food in the Last Year: Not on file  Transportation Needs:   . Lack of Transportation (Medical): Not on file  . Lack of Transportation (Non-Medical): Not on file  Physical Activity:   . Days of Exercise per Week: Not on file  . Minutes of Exercise per Session: Not on file    Stress:   . Feeling of Stress : Not on file  Social Connections:   . Frequency of Communication with Friends and Family: Not on file  . Frequency of Social Gatherings with Friends and Family: Not on file  . Attends Religious Services: Not on file  . Active Member of Clubs or Organizations: Not on file  . Attends Banker Meetings: Not on file  . Marital Status: Not on file  Intimate Partner Violence:   . Fear of Current or Ex-Partner: Not on file  . Emotionally Abused: Not on file  . Physically Abused: Not on file  . Sexually Abused: Not on file    Outpatient Medications Prior to Visit  Medication Sig Dispense Refill  . albuterol (VENTOLIN HFA) 108 (90 Base) MCG/ACT inhaler Inhale 2 puffs into the lungs every 4 (four) hours as needed for wheezing or shortness of breath. 18 g 0  . amLODipine (NORVASC) 10 MG tablet Take 1 tablet (10 mg total) by mouth daily. 90 tablet 3  . benzonatate (TESSALON) 100 MG capsule Take 1 capsule (100 mg total) by mouth every 8 (eight) hours. 21 capsule 0  . cetirizine (  ZYRTEC ALLERGY) 10 MG tablet Take 1 tablet (10 mg total) by mouth daily. 30 tablet 0  . fluticasone (FLONASE) 50 MCG/ACT nasal spray Place 1 spray into both nostrils daily. 16 g 0   No facility-administered medications prior to visit.    Allergies  Allergen Reactions  . Ritalin [Methylphenidate Hcl] Rash    ROS Review of Systems  Constitutional: Negative for chills and fever.  Respiratory: Negative for cough and shortness of breath.   Cardiovascular: Negative for chest pain and palpitations.  Gastrointestinal: Negative for abdominal pain, constipation, diarrhea and nausea.  Endocrine: Negative for polydipsia, polyphagia and polyuria.  Genitourinary: Negative for dysuria, flank pain, frequency, menstrual problem and pelvic pain.  Musculoskeletal: Negative for arthralgias and back pain.  Neurological: Negative for dizziness and headaches.  Hematological: Negative for  adenopathy. Does not bruise/bleed easily.      Objective:    Physical Exam Vitals and nursing note reviewed. Exam conducted with a chaperone present.  Constitutional:      Appearance: Normal appearance.  Cardiovascular:     Rate and Rhythm: Normal rate and regular rhythm.  Pulmonary:     Effort: Pulmonary effort is normal.     Breath sounds: Normal breath sounds.  Abdominal:     Palpations: Abdomen is soft.     Tenderness: There is no right CVA tenderness, left CVA tenderness, guarding or rebound.  Genitourinary:    Vagina: Vaginal discharge present.     Comments: Normal appearance to the cervix; presence of white vaginal discharge within the canal; no CMT with collection of pap; not able to perform bimaunal exam due to patient's discomfort Musculoskeletal:     Right lower leg: No edema.     Left lower leg: No edema.  Skin:    General: Skin is warm and dry.  Neurological:     General: No focal deficit present.     Mental Status: She is alert and oriented to person, place, and time.  Psychiatric:        Mood and Affect: Mood normal.        Behavior: Behavior normal.     BP 115/74   Pulse 80   Temp 98.7 F (37.1 C)   Resp 16   Wt 198 lb 3.2 oz (89.9 kg)   LMP 02/18/2020   SpO2 100%   BMI 35.11 kg/m  Wt Readings from Last 3 Encounters:  03/08/20 198 lb 3.2 oz (89.9 kg)  03/01/20 197 lb (89.4 kg)  02/24/20 197 lb (89.4 kg)     Health Maintenance Due  Topic Date Due  . PAP-Cervical Cytology Screening  Never done  . PAP SMEAR-Modifier  Never done  . COVID-19 Vaccine (2 - Moderna 2-dose series) 02/14/2020      Lab Results  Component Value Date   TSH 3.050 01/17/2020   Lab Results  Component Value Date   WBC 10.9 (H) 01/17/2020   HGB 11.3 01/17/2020   HCT 37.1 01/17/2020   MCV 80 01/17/2020   PLT 257 01/17/2020   Lab Results  Component Value Date   NA 138 01/17/2020   K 3.8 01/17/2020   CO2 22 01/17/2020   GLUCOSE 86 01/17/2020   BUN 10  01/17/2020   CREATININE 0.82 01/17/2020   BILITOT <0.2 01/17/2020   ALKPHOS 85 01/17/2020   AST 16 01/17/2020   ALT 13 01/17/2020   PROT 6.2 01/17/2020   ALBUMIN 3.9 01/17/2020   CALCIUM 8.9 01/17/2020   ANIONGAP 10 10/07/2016   No results  found for: CHOL No results found for: HDL No results found for: LDLCALC No results found for: TRIG No results found for: CHOLHDL No results found for: RAQT6A    Assessment & Plan:  1. Screening for cervical cancer Pap done at today's visit and smallest available sized speculum used to help decrease patient's discomfort with exam - Cytology - PAP([place])  2. Vaginal discharge She will be notified of the results of the Aptiva swab test done at today's visit and if any further treatment is needed based on the results - Cervicovaginal ancillary only   Follow-up: Return for keep scheduled f/u with Dr. Delford Field.   Cain Saupe, MD

## 2020-03-09 ENCOUNTER — Other Ambulatory Visit: Payer: Self-pay | Admitting: Family Medicine

## 2020-03-09 DIAGNOSIS — N760 Acute vaginitis: Secondary | ICD-10-CM

## 2020-03-09 DIAGNOSIS — B9689 Other specified bacterial agents as the cause of diseases classified elsewhere: Secondary | ICD-10-CM

## 2020-03-09 LAB — CERVICOVAGINAL ANCILLARY ONLY
Bacterial Vaginitis (gardnerella): POSITIVE — AB
Candida Glabrata: NEGATIVE
Candida Vaginitis: NEGATIVE
Chlamydia: NEGATIVE
Comment: NEGATIVE
Comment: NEGATIVE
Comment: NEGATIVE
Comment: NEGATIVE
Comment: NEGATIVE
Comment: NORMAL
Neisseria Gonorrhea: NEGATIVE
Trichomonas: NEGATIVE

## 2020-03-09 LAB — CYTOLOGY - PAP: Diagnosis: NEGATIVE

## 2020-03-09 MED ORDER — METRONIDAZOLE 500 MG PO TABS: 500.0000 mg | ORAL_TABLET | Freq: Two times a day (BID) | ORAL | 0 refills | Status: AC

## 2020-03-21 MED FILL — AMLODIPINE BESYLATE 10 MG T: 10 | 30 days supply | Qty: 30 | Fill #2

## 2020-04-28 MED FILL — AMLODIPINE BESYLATE 10 MG T: 10 | 30 days supply | Qty: 30 | Fill #3

## 2020-06-05 ENCOUNTER — Ambulatory Visit
Admission: EM | Admit: 2020-06-05 | Discharge: 2020-06-05 | Disposition: A | Discharge status: Home / Self Care | Payer: 59 | Attending: Emergency Medicine | Admitting: Emergency Medicine

## 2020-06-05 DIAGNOSIS — S161XXA Strain of muscle, fascia and tendon at neck level, initial encounter: Secondary | ICD-10-CM

## 2020-06-05 DIAGNOSIS — M546 Pain in thoracic spine: Secondary | ICD-10-CM | POA: Diagnosis not present

## 2020-06-05 MED ORDER — IBUPROFEN 800 MG PO TABS: 800.0000 mg | ORAL_TABLET | Freq: Three times a day (TID) | ORAL | 0 refills | Status: DC

## 2020-06-05 MED ORDER — TIZANIDINE HCL 4 MG PO TABS: 4.0000 mg | ORAL_TABLET | Freq: Four times a day (QID) | ORAL | 0 refills | Status: DC | PRN

## 2020-06-05 NOTE — Discharge Instructions (Signed)
Use anti-inflammatories for pain/swelling. You may take up to 800 mg Ibuprofen every 8 hours with food. You may supplement Ibuprofen with Tylenol [phone] mg every 8 hours.  Supplement tizanidine as needed at home/bedtime, may cause drowsiness is a muscle relaxer Gentle stretching Alternate ice and heat Follow-up if not improving or worse
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
## 2020-06-29 ENCOUNTER — Other Ambulatory Visit: Payer: Self-pay

## 2020-06-29 ENCOUNTER — Ambulatory Visit: Payer: 59 | Attending: Critical Care Medicine | Admitting: Critical Care Medicine

## 2020-06-29 ENCOUNTER — Encounter: Payer: Self-pay | Admitting: Critical Care Medicine

## 2020-06-29 VITALS — BP 119/84 | HR 80 | Temp 98.3°F | Resp 16 | Ht 64.0 in | Wt 196.0 lb

## 2020-06-29 DIAGNOSIS — N920 Excessive and frequent menstruation with regular cycle: Secondary | ICD-10-CM | POA: Diagnosis not present

## 2020-06-29 DIAGNOSIS — G8929 Other chronic pain: Secondary | ICD-10-CM

## 2020-06-29 DIAGNOSIS — F17200 Nicotine dependence, unspecified, uncomplicated: Secondary | ICD-10-CM

## 2020-06-29 DIAGNOSIS — Z6833 Body mass index (BMI) 33.0-33.9, adult: Secondary | ICD-10-CM

## 2020-06-29 DIAGNOSIS — K219 Gastro-esophageal reflux disease without esophagitis: Secondary | ICD-10-CM

## 2020-06-29 DIAGNOSIS — I10 Essential (primary) hypertension: Secondary | ICD-10-CM

## 2020-06-29 DIAGNOSIS — R109 Unspecified abdominal pain: Secondary | ICD-10-CM | POA: Diagnosis not present

## 2020-06-29 MED ORDER — AMLODIPINE BESYLATE 10 MG PO TABS: 10.0000 mg | ORAL_TABLET | Freq: Every day | ORAL | 3 refills | Status: DC

## 2020-06-29 NOTE — Assessment & Plan Note (Signed)
Healthy diet instruction given

## 2020-06-29 NOTE — Assessment & Plan Note (Signed)
Referral to gynecology

## 2020-06-29 NOTE — Progress Notes (Signed)
Feels cold a lot  States iron is low and donates plasma

## 2020-06-29 NOTE — Assessment & Plan Note (Signed)
Hypertension well controlled no change in medications refills given

## 2020-06-29 NOTE — Assessment & Plan Note (Signed)
  .   Current smoking consumption amount: Three cigarettes per day  . Dicsussion on advise to quit smoking and smoking impacts: Cardiovascular health impacts  . Patient's willingness to quit: Willing to consider quitting  . Methods to quit smoking discussed: Behavioral modification  . Medication management of smoking session drugs discussed: Nicotine replacement therapy  . Resources provided:  AVS   . Setting quit date not determined  . Follow-up arranged 2 months   Time spent counseling the patient: 5 minutes

## 2020-06-29 NOTE — Progress Notes (Signed)
Subjective:    Patient ID: Sarah Small, female    DOB: [birth year], 30 y.o.   MRN: [id number]  01/17/20 29 y.o.F who is seen today accompanied by her partner for evaluation of blood pressure.  The patient does not have a current primary care provider but has been diagnosed with elevation in blood pressure readings in the past.  She has been concerned about her blood pressure and that when it was checked previously at a drugstore it was in the 155/80 range.  She complains of progression of headaches over the past 2 years.  When she goes to donate blood at a plasma center also her blood pressures been checked to be elevated at 155/80.  She notes a sharp pain in her head but denies dizziness or eye changes.  She has no chest pain or shortness of breath.  She does have occasional slight edema in the feet.  She denies any wheezing or cough.  She does occasionally snore at night according to her partner.  She notes belching and burping and occasional reflux symptoms.  She smokes 6 cigarettes daily but does not drink alcohol or other substances.  There is no other past medical history that is documented.  02/24/2020 Patient returns in follow-up has received the first of her Materna vaccines has another one follow-up in a week.  She complains of some dryness in the hands and feet and she works as a IT trainer.  She is due a Pap smear.  She does have increased anxiety symptoms and scores high today on her GAD-7 score scale.  Blood pressure on arrival 126/85 she is still on the amlodipine  06/29/2020 this patient is seen in return follow-up for chronic reflux with associated abdominal pain hypertension tobacco use.  Patient is now down to about 3 cigarettes daily.  She has been donating plasma a great deal and was told by the infusion center that she has low iron levels and cannot give plasma as much.  Patient notes excessive bleeding with periods she has been compliant with her blood  pressure medicine on arrival blood pressure is 118/84 patient is cold natured all the time  Past Medical History:  Diagnosis Date  . Hypertension      Family History  Problem Relation Age of Onset  . Hypertension Mother   . Heart failure Mother      Social History   Socioeconomic History  . Marital status: Single    Spouse name: Not on file  . Number of children: Not on file  . Years of education: Not on file  . Highest education level: Not on file  Occupational History  . Not on file  Tobacco Use  . Smoking status: Current Every Day Smoker    Packs/day: 0.25    Years: 9.00    Pack years: 2.25    Types: Cigarettes  . Smokeless tobacco: Never Used  Substance and Sexual Activity  . Alcohol use: No  . Drug use: Yes    Types: Marijuana    Comment: twice a week   . Sexual activity: Yes  Other Topics Concern  . Not on file  Social History Narrative   ** Merged History Encounter **       Social Determinants of Health   Financial Resource Strain: Not on file  Food Insecurity: Not on file  Transportation Needs: Not on file  Physical Activity: Not on file  Stress: Not on file  Social Connections: Not on file  Intimate Partner Violence: Not on file     Allergies  Allergen Reactions  . Ritalin [Methylphenidate Hcl] Rash     Outpatient Medications Prior to Visit  Medication Sig Dispense Refill  . amLODipine (NORVASC) 10 MG tablet Take 1 tablet (10 mg total) by mouth daily. 90 tablet 3  . albuterol (VENTOLIN HFA) 108 (90 Base) MCG/ACT inhaler Inhale 2 puffs into the lungs every 4 (four) hours as needed for wheezing or shortness of breath. (Patient not taking: Reported on 06/29/2020) 18 g 0  . cetirizine (ZYRTEC ALLERGY) 10 MG tablet Take 1 tablet (10 mg total) by mouth daily. (Patient not taking: Reported on 06/29/2020) 30 tablet 0  . fluticasone (FLONASE) 50 MCG/ACT nasal spray Place 1 spray into both nostrils daily. (Patient not taking: Reported on 06/29/2020) 16 g 0   . ibuprofen (ADVIL) 800 MG tablet Take 1 tablet (800 mg total) by mouth 3 (three) times daily. (Patient not taking: Reported on 06/29/2020) 21 tablet 0  . tiZANidine (ZANAFLEX) 4 MG tablet Take 1 tablet (4 mg total) by mouth every 6 (six) hours as needed for muscle spasms. (Patient not taking: Reported on 06/29/2020) 30 tablet 0   No facility-administered medications prior to visit.       Review of Systems  Constitutional: Positive for fatigue.  HENT: Negative.   Eyes: Negative.   Respiratory: Negative.   Cardiovascular: Negative for chest pain, palpitations and leg swelling.  Gastrointestinal: Negative for abdominal distention, abdominal pain, anal bleeding, blood in stool, constipation, diarrhea, nausea and vomiting.       Gerd No ulcer  Endocrine: Positive for cold intolerance.  Genitourinary: Positive for vaginal bleeding.       Excessive menses  Musculoskeletal: Negative.   Neurological: Negative for dizziness, light-headedness and headaches.  Psychiatric/Behavioral: Negative.        Objective:   Physical Exam Vitals:   06/29/20 0925  BP: 119/84  Pulse: 80  Resp: 16  Temp: 98.3 F (36.8 C)  TempSrc: Oral  SpO2: 99%  Weight: 196 lb (88.9 kg)  Height: 5\' 4"  (1.626 m)    Gen: Pleasant, obese, in no distress,  normal affect  ENT: No lesions,  mouth clear,  oropharynx clear, no postnasal drip  Neck: No JVD, no TMG, no carotid bruits  Lungs: No use of accessory muscles, no dullness to percussion, clear without rales or rhonchi  Cardiovascular: RRR, heart sounds normal, no murmur or gallops, no peripheral edema  Abdomen: soft and NT, no HSM,  BS normal  Musculoskeletal: No deformities, no cyanosis or clubbing  Neuro: alert, non focal  Skin: Warm, no lesions or rashes         Assessment & Plan:  I personally reviewed all images and lab data in the [facility] system as well as any outside material available during this office visit and agree with the  radiology  impressions.   Essential hypertension Hypertension well controlled no change in medications refills given  Gastroesophageal reflux disease without esophagitis Chronic abdominal pain with reflux disease possible iron deficiency anemia   check iron levels CBC  Check H. pylori exhalation test    Chronic abdominal pain As per reflux assessment  Menorrhagia with regular cycle Referral to gynecology  Tobacco dependency    . Current smoking consumption amount: Three cigarettes per day  . Dicsussion on advise to quit smoking and smoking impacts: Cardiovascular health impacts  . Patient's willingness to quit: Willing to consider quitting  . Methods to quit smoking discussed: Behavioral  modification  . Medication management of smoking session drugs discussed: Nicotine replacement therapy  . Resources provided:  AVS   . Setting quit date not determined  . Follow-up arranged 2 months   Time spent counseling the patient: 5 minutes   BMI 33.0-33.9,adult Healthy diet instruction given   Diagnoses and all orders for this visit:  Menorrhagia with regular cycle -     Iron -     CBC with Differential/Platelet; Future -     Ambulatory referral to Gynecology  Chronic abdominal pain -     Cancel: H. pylori Breath Collection  Gastroesophageal reflux disease without esophagitis -     H. pylori breath test  Other orders -     amLODipine (NORVASC) 10 MG tablet; Take 1 tablet (10 mg total) by mouth daily.

## 2020-06-29 NOTE — Assessment & Plan Note (Signed)
Chronic abdominal pain with reflux disease possible iron deficiency anemia   check iron levels CBC  Check H. pylori exhalation test

## 2020-06-29 NOTE — Assessment & Plan Note (Signed)
As per reflux assessment °

## 2020-06-29 NOTE — Patient Instructions (Signed)
Labs today include iron levels blood counts and we will set up a breath test for H. Pylori  Focus on smoking cessation  Continue amlodipine daily refills given  Referral to gynecology be made for your excess menstrual periods we will have you go see Dr. Earlene Plater [facility]  Please obtain your COVID-vaccine information below as we can get it COVID-19 Vaccine Information can be found at: [email or web address] For questions related to vaccine distribution or appointments, please email [email or web address] or call [phone].    Return Dr. Delford Field 4 months

## 2020-06-30 LAB — IRON: Iron: 81 ug/dL (ref 27–159)

## 2020-06-30 LAB — H. PYLORI BREATH TEST: H pylori Breath Test: NEGATIVE

## 2020-06-30 NOTE — Progress Notes (Signed)
Let pt know iron level is normal

## 2020-07-21 ENCOUNTER — Other Ambulatory Visit: Payer: Self-pay

## 2020-07-21 ENCOUNTER — Encounter: Payer: Self-pay | Admitting: Internal Medicine

## 2020-07-21 ENCOUNTER — Other Ambulatory Visit (HOSPITAL_COMMUNITY)
Admission: RE | Admit: 2020-07-21 | Discharge: 2020-07-21 | Disposition: A | Discharge status: Home / Self Care | Payer: 59 | Source: Ambulatory Visit | Attending: Internal Medicine | Admitting: Internal Medicine

## 2020-07-21 ENCOUNTER — Ambulatory Visit (INDEPENDENT_AMBULATORY_CARE_PROVIDER_SITE_OTHER): Payer: 59 | Admitting: Internal Medicine

## 2020-07-21 VITALS — BP 145/76 | HR 59 | Temp 98.9°F | Resp 18 | Ht 63.0 in | Wt 193.8 lb

## 2020-07-21 DIAGNOSIS — N920 Excessive and frequent menstruation with regular cycle: Secondary | ICD-10-CM | POA: Diagnosis not present

## 2020-07-21 NOTE — Patient Instructions (Signed)
Please call radiology central scheduling at [phone] to schedule your ultrasound.

## 2020-07-21 NOTE — Progress Notes (Signed)
  Subjective:    Sarah Small - 29 y.o. female MRN [id number]  Date of birth: 11/22/1990  HPI  Sarah Small is here for menorrhagia with regular cycle. Reports heavy menses for past 2 years. Has associated cramping that occurs only with menses. No nausea, vomiting, pelvic pain outside of menses, vaginal discharge. First menstrual cycle at age 80. Never been pregnant. Never used hormonal contraception. Not on anticoagulation. No personal or family history of coagulation disorder.      Health Maintenance:  Health Maintenance Due  Topic Date Due  . INFLUENZA VACCINE  Never done  . COVID-19 Vaccine (2 - Moderna 3-dose series) 02/14/2020    -  reports that she has been smoking cigarettes. She has a 2.25 pack-year smoking history. She has never used smokeless tobacco. - Review of Systems: Per HPI. - Past Medical History: Patient Active Problem List   Diagnosis Date Noted  . Chronic abdominal pain 06/29/2020  . Gastroesophageal reflux disease without esophagitis 06/29/2020  . Menorrhagia with regular cycle 06/29/2020  . BMI 33.0-33.9,adult 06/29/2020  . Tobacco dependency 02/24/2020  . Essential hypertension 01/17/2020   - Medications: reviewed and updated   Objective:   Physical Exam BP (!) 145/76 (BP Location: Right Arm, Patient Position: Sitting, Cuff Size: Large)   Pulse (!) 59   Temp 98.9 F (37.2 C) (Oral)   Resp 18   Ht 5\' 3"  (1.6 m)   Wt 193 lb 12.8 oz (87.9 kg)   LMP 06/27/2020   SpO2 98%   BMI 34.33 kg/m  Physical Exam Constitutional:      General: She is not in acute distress.    Appearance: She is not diaphoretic.  Cardiovascular:     Rate and Rhythm: Normal rate.  Pulmonary:     Effort: Pulmonary effort is normal. No respiratory distress.  Genitourinary:    Comments: GU/GYN: Exam performed in the presence of a chaperone. External genitalia within normal limits.  Vaginal mucosa pink, moist, normal rugae.  Nonfriable cervix without lesions, no discharge  or bleeding noted on speculum exam.  Bimanual exam revealed normal, nongravid uterus.  No cervical motion tenderness. No adnexal masses bilaterally.    Musculoskeletal:        General: Normal range of motion.  Skin:    General: Skin is warm and dry.  Neurological:     Mental Status: She is alert and oriented to person, place, and time.  Psychiatric:        Mood and Affect: Affect normal.        Judgment: Judgment normal.            Assessment & Plan:   1. Menorrhagia with regular cycle Pelvic exam unremarkable. Culture sent. Suspect heavy menstrual pattern related to BMI >30 but could also be related to anatomical abnormality such as fibroid uterus and will evaluate with pelvic sono. Will also check CBC and TSH. Discussed that if work up unremarkable, would suggest hormonal method of regulating menstrual cycle such as OCPs (would warrant further discussion of risks due to smoking history) or IUD. Of note, patient very uncomfortable with brief pelvic exam so IUD may be difficult to place.  - CBC - TSH - 08/25/2020 PELVIC COMPLETE WITH TRANSVAGINAL; Future - Cervicovaginal ancillary only   [place], D.O. 07/21/2020, 11:23 AM Primary Care at [facility]

## 2020-07-22 LAB — CBC
Hematocrit: 37.9 % (ref 34.0–46.6)
Hemoglobin: 12.1 g/dL (ref 11.1–15.9)
MCH: 25.2 pg — ABNORMAL LOW (ref 26.6–33.0)
MCHC: 31.9 g/dL (ref 31.5–35.7)
MCV: 79 fL (ref 79–97)
Platelets: 254 10*3/uL (ref 150–450)
RBC: 4.8 x10E6/uL (ref 3.77–5.28)
RDW: 13.6 % (ref 11.7–15.4)
WBC: 6.9 10*3/uL (ref 3.4–10.8)

## 2020-07-22 LAB — TSH: TSH: 1.13 u[IU]/mL (ref 0.450–4.500)

## 2020-07-23 LAB — CERVICOVAGINAL ANCILLARY ONLY
Bacterial Vaginitis (gardnerella): POSITIVE — AB
Candida Glabrata: NEGATIVE
Candida Vaginitis: NEGATIVE
Chlamydia: NEGATIVE
Comment: NEGATIVE
Comment: NEGATIVE
Comment: NEGATIVE
Comment: NEGATIVE
Comment: NEGATIVE
Comment: NORMAL
Neisseria Gonorrhea: NEGATIVE
Trichomonas: NEGATIVE

## 2020-07-25 ENCOUNTER — Other Ambulatory Visit: Payer: Self-pay | Admitting: Internal Medicine

## 2020-07-25 MED ORDER — METRONIDAZOLE 500 MG PO TABS: 500.0000 mg | ORAL_TABLET | Freq: Two times a day (BID) | ORAL | 0 refills | Status: DC

## 2020-08-02 ENCOUNTER — Other Ambulatory Visit: Payer: 59

## 2020-10-29 NOTE — Progress Notes (Deleted)
Subjective:    Patient ID: Sarah Small, female    DOB: Dec 05, 1990, 30 y.o.   MRN: [id number]  01/17/20 29 y.o.F who is seen today accompanied by her partner for evaluation of blood pressure.  The patient does not have a current primary care provider but has been diagnosed with elevation in blood pressure readings in the past.  She has been concerned about her blood pressure and that when it was checked previously at a drugstore it was in the 155/80 range.  She complains of progression of headaches over the past 2 years.  When she goes to donate blood at a plasma center also her blood pressures been checked to be elevated at 155/80.  She notes a sharp pain in her head but denies dizziness or eye changes.  She has no chest pain or shortness of breath.  She does have occasional slight edema in the feet.  She denies any wheezing or cough.  She does occasionally snore at night according to her partner.  She notes belching and burping and occasional reflux symptoms.  She smokes 6 cigarettes daily but does not drink alcohol or other substances.  There is no other past medical history that is documented.  02/24/2020 Patient returns in follow-up has received the first of her Materna vaccines has another one follow-up in a week.  She complains of some dryness in the hands and feet and she works as a IT trainer.  She is due a Pap smear.  She does have increased anxiety symptoms and scores high today on her GAD-7 score scale.  Blood pressure on arrival 126/85 she is still on the amlodipine  06/29/2020 this patient is seen in return follow-up for chronic reflux with associated abdominal pain hypertension tobacco use.  Patient is now down to about 3 cigarettes daily.  She has been donating plasma a great deal and was told by the infusion center that she has low iron levels and cannot give plasma as much.  Patient notes excessive bleeding with periods she has been compliant with her blood  pressure medicine on arrival blood pressure is 118/84 patient is cold natured all the time  5/16  Essential hypertension Hypertension well controlled no change in medications refills given  Gastroesophageal reflux disease without esophagitis Chronic abdominal pain with reflux disease possible iron deficiency anemia   check iron levels CBC  Check H. pylori exhalation test    Chronic abdominal pain As per reflux assessment  Menorrhagia with regular cycle Referral to gynecology  Tobacco dependency    Current smoking consumption amount: Three cigarettes per day  Dicsussion on advise to quit smoking and smoking impacts: Cardiovascular health impacts  Patient's willingness to quit: Willing to consider quitting  Methods to quit smoking discussed: Behavioral modification  Medication management of smoking session drugs discussed: Nicotine replacement therapy  Resources provided:  AVS   Setting quit date not determined  Follow-up arranged 2 months   Time spent counseling the patient: 5 minutes   BMI 33.0-33.9,adult Healthy diet instruction given   Diagnoses and all orders for this visit:  Menorrhagia with regular cycle -     Iron -     CBC with Differential/Platelet; Future -     Ambulatory referral to Gynecology  Chronic abdominal pain -     Cancel: H. pylori Breath Collection  Gastroesophageal reflux disease without esophagitis -     H. pylori breath test  Other orders -     amLODipine (NORVASC) 10 MG  tablet; Take 1 tablet (10 mg total) by mouth daily.      Past Medical History:  Diagnosis Date  . Hypertension      Family History  Problem Relation Age of Onset  . Hypertension Mother   . Heart failure Mother      Social History   Socioeconomic History  . Marital status: Single    Spouse name: Not on file  . Number of children: Not on file  . Years of education: Not on file  . Highest education level: Not on file  Occupational History   . Not on file  Tobacco Use  . Smoking status: Current Every Day Smoker    Packs/day: 0.25    Years: 9.00    Pack years: 2.25    Types: Cigarettes  . Smokeless tobacco: Never Used  Substance and Sexual Activity  . Alcohol use: No  . Drug use: Yes    Types: Marijuana    Comment: twice a week   . Sexual activity: Yes  Other Topics Concern  . Not on file  Social History Narrative   ** Merged History Encounter **       Social Determinants of Health   Financial Resource Strain: Not on file  Food Insecurity: Not on file  Transportation Needs: Not on file  Physical Activity: Not on file  Stress: Not on file  Social Connections: Not on file  Intimate Partner Violence: Not on file     Allergies  Allergen Reactions  . Ritalin [Methylphenidate Hcl] Rash     Outpatient Medications Prior to Visit  Medication Sig Dispense Refill  . amLODipine (NORVASC) 10 MG tablet Take 1 tablet (10 mg total) by mouth daily. 90 tablet 3  . amLODipine (NORVASC) 10 MG tablet TAKE 1 TABLET (10 MG TOTAL) BY MOUTH DAILY. 30 tablet 3  . metroNIDAZOLE (FLAGYL) 500 MG tablet Take 1 tablet (500 mg total) by mouth 2 (two) times daily. 14 tablet 0   No facility-administered medications prior to visit.       Review of Systems  Constitutional: Positive for fatigue.  HENT: Negative.   Eyes: Negative.   Respiratory: Negative.   Cardiovascular: Negative for chest pain, palpitations and leg swelling.  Gastrointestinal: Negative for abdominal distention, abdominal pain, anal bleeding, blood in stool, constipation, diarrhea, nausea and vomiting.       Gerd No ulcer  Endocrine: Positive for cold intolerance.  Genitourinary: Positive for vaginal bleeding.       Excessive menses  Musculoskeletal: Negative.   Neurological: Negative for dizziness, light-headedness and headaches.  Psychiatric/Behavioral: Negative.        Objective:   Physical Exam There were no vitals filed for this visit.  Gen:  Pleasant, obese, in no distress,  normal affect  ENT: No lesions,  mouth clear,  oropharynx clear, no postnasal drip  Neck: No JVD, no TMG, no carotid bruits  Lungs: No use of accessory muscles, no dullness to percussion, clear without rales or rhonchi  Cardiovascular: RRR, heart sounds normal, no murmur or gallops, no peripheral edema  Abdomen: soft and NT, no HSM,  BS normal  Musculoskeletal: No deformities, no cyanosis or clubbing  Neuro: alert, non focal  Skin: Warm, no lesions or rashes         Assessment & Plan:  I personally reviewed all images and lab data in the [facility] system as well as any outside material available during this office visit and agree with the  radiology impressions.   No  problem-specific Assessment & Plan notes found for this encounter.   Diagnoses and all orders for this visit:  Menorrhagia with regular cycle -     Iron -     CBC with Differential/Platelet; Future -     Ambulatory referral to Gynecology  Chronic abdominal pain -     Cancel: H. pylori Breath Collection  Gastroesophageal reflux disease without esophagitis -     H. pylori breath test  Other orders -     amLODipine (NORVASC) 10 MG tablet; Take 1 tablet (10 mg total) by mouth daily.

## 2020-10-30 ENCOUNTER — Ambulatory Visit: Payer: 59 | Admitting: Critical Care Medicine

## 2020-12-13 ENCOUNTER — Ambulatory Visit: Payer: 59 | Admitting: Critical Care Medicine

## 2021-04-03 ENCOUNTER — Ambulatory Visit
Admission: EM | Admit: 2021-04-03 | Discharge: 2021-04-03 | Disposition: A | Discharge status: Home / Self Care | Payer: 59 | Attending: Internal Medicine | Admitting: Internal Medicine

## 2021-04-03 ENCOUNTER — Encounter: Payer: Self-pay | Admitting: Emergency Medicine

## 2021-04-03 ENCOUNTER — Encounter: Payer: Self-pay | Admitting: *Deleted

## 2021-04-03 ENCOUNTER — Ambulatory Visit (INDEPENDENT_AMBULATORY_CARE_PROVIDER_SITE_OTHER): Payer: 59

## 2021-04-03 ENCOUNTER — Ambulatory Visit: Payer: Self-pay | Admitting: *Deleted

## 2021-04-03 ENCOUNTER — Other Ambulatory Visit: Payer: Self-pay

## 2021-04-03 DIAGNOSIS — R059 Cough, unspecified: Secondary | ICD-10-CM

## 2021-04-03 DIAGNOSIS — Z20822 Contact with and (suspected) exposure to covid-19: Secondary | ICD-10-CM

## 2021-04-03 DIAGNOSIS — J069 Acute upper respiratory infection, unspecified: Secondary | ICD-10-CM

## 2021-04-03 DIAGNOSIS — R509 Fever, unspecified: Secondary | ICD-10-CM | POA: Diagnosis not present

## 2021-04-03 MED ORDER — VALSARTAN 160 MG PO TABS: 160.0000 mg | ORAL_TABLET | Freq: Every day | ORAL | 3 refills | Status: DC

## 2021-04-03 MED ORDER — AMLODIPINE BESYLATE 10 MG PO TABS: ORAL_TABLET | Freq: Every day | ORAL | 3 refills | Status: DC

## 2021-04-03 MED ORDER — BENZONATATE 100 MG PO CAPS: 100.0000 mg | ORAL_CAPSULE | Freq: Three times a day (TID) | ORAL | 0 refills | Status: DC | PRN

## 2021-04-03 MED ORDER — PREDNISONE 20 MG PO TABS: 40.0000 mg | ORAL_TABLET | Freq: Every day | ORAL | 0 refills | Status: AC

## 2021-04-03 NOTE — Telephone Encounter (Signed)
Pt was seen today at the urgent care at [facility].   Her BP was elevated on 2 readings.   She did take her BP medication this morning.    They instructed her to call her PCP and see if she should take another BP pill today for the elevated BP.  See triage notes  She doesn't have a BP machine so has not been monitoring her BP.   She does feel like it's been running high because she has a headache like when Dr. Shan Levans started her on the BP medication.    She was diagnosed with an upper respiratory infection at urgent care today.   She's going to borrow her mother's BP machine and take readings over the next several days and see if it's an isolated incident or constantly elevated.  I sent her question to [facility] Wellness to Dr. Shan Levans for further directions regarding an extra dose of BP medication or not.   Someone will call her back.   Her number is [phone]. She is agreeable to this plan.

## 2021-04-03 NOTE — Discharge Instructions (Signed)
X-ray was negative.  You have a viral upper respiratory infection that is causing your symptoms.  This should resolve in the next few days with supportive care.  You have been prescribed prednisone steroid to decrease inflammation in chest and help alleviate cough.  You have also been prescribed cough medication to take as needed.  COVID-19 test is pending.  We will call if it is positive.  Please restart your blood pressure medications as well.

## 2021-04-03 NOTE — ED Triage Notes (Signed)
Cough, fever x 4 days, began vomiting, generalized body aches. Nephew has pneumonia, concerned about possibly having same. Negative home covid test. OTC meds have not helped symptoms.

## 2021-04-03 NOTE — Telephone Encounter (Signed)
Reason for Disposition  [1] Caller has URGENT medicine question about med that PCP or specialist prescribed AND [2] triager unable to answer question    Should she take an extra dose of her BP medication?   BP elevated at the urgent care today  Answer Assessment - Initial Assessment Questions 1. NAME of MEDICATION: "What medicine are you calling about?"     Pt calling in.    Primary Care at [facility] saw me today.   They said my BP was high.   I take BP medication.    She did take her BP medication this morning.   They told me to call y'all and see what she is to do.    I did take my BP medication this morning.  I've been vomiting and I'm sick.   I went to urgent care 175/99 1st reading.   They checked my BP 155/105 on the 2nd reading.    I asked them should I take a 2nd dose of my BP medication.   They told me to call y'all.    Is it ok for me to take a 2nd dose of my BP medication?   I don't know if it's been high before today or not.    I don't have a BP machine.   I have headache.   2. QUESTION: "What is your question?" (e.g., double dose of medicine, side effect)     My mother has a BP machine I can borrow. 3. PRESCRIBING HCP: "Who prescribed it?" Reason: if prescribed by specialist, call should be referred to that group.     Dr. Shan Levans 4. SYMPTOMS: "Do you have any symptoms?"     Headache but I'm sick with a upper respiratory illness.   I think the headache is from the elevated BP. 5. SEVERITY: If symptoms are present, ask "Are they mild, moderate or severe?"     Headache   6. PREGNANCY:  "Is there any chance that you are pregnant?" "When was your last menstrual period?"     Not asked  Protocols used: Medication Question Call-A-AH

## 2021-04-03 NOTE — Telephone Encounter (Signed)
Will route to PCP for review. 

## 2021-04-03 NOTE — ED Provider Notes (Signed)
[facility]    CSN: [id number] Arrival date & time: 04/03/21  1638      History   Chief Complaint Chief Complaint  Patient presents with   Emesis   Cough   Fever    HPI Sarah Small is a 30 y.o. female.   Patient presents with cough, nasal congestion, fever, nausea, generalized body aches, vomiting that has been present for 4 days.  Denies diarrhea or abdominal pain.  Patient not sure of T-max at home but "felt feverish".  Denies any chest pain or shortness of breath.  Family member had similar symptoms.  Has taken multiple over-the-counter cold and flu medications with minimal improvement in symptoms.  Denies any chronic lung diseases.  Cough is productive with "cloudy sputum".  Patient also has elevated blood pressure reading in urgent care today.  Patient reports that she has not taken her blood pressure medication today.  Denies headache, chest pain, blurred vision, nausea, vomiting, dizziness, shortness of breath.   Emesis Cough Fever  Past Medical History:  Diagnosis Date   Hypertension     Patient Active Problem List   Diagnosis Date Noted   Chronic abdominal pain 06/29/2020   Gastroesophageal reflux disease without esophagitis 06/29/2020   Menorrhagia with regular cycle 06/29/2020   BMI 33.0-33.9,adult 06/29/2020   Tobacco dependency 02/24/2020   Essential hypertension 01/17/2020    History reviewed. No pertinent surgical history.  OB History   No obstetric history on file.      Home Medications    Prior to Admission medications   Medication Sig Start Date End Date Taking? Authorizing Provider  benzonatate (TESSALON) 100 MG capsule Take 1 capsule (100 mg total) by mouth every 8 (eight) hours as needed for cough. 04/03/21  Yes Lance Muss, FNP  predniSONE (DELTASONE) 20 MG tablet Take 2 tablets (40 mg total) by mouth daily for 5 days. 04/03/21 04/08/21 Yes Lance Muss, FNP  amLODipine (NORVASC) 10 MG tablet Take 1 tablet (10 mg  total) by mouth daily. 06/29/20   Storm Frisk, MD  amLODipine (NORVASC) 10 MG tablet TAKE 1 TABLET (10 MG TOTAL) BY MOUTH DAILY. 01/17/20 01/16/21  Storm Frisk, MD  metroNIDAZOLE (FLAGYL) 500 MG tablet Take 1 tablet (500 mg total) by mouth 2 (two) times daily. 07/25/20   Arvilla Market, MD    Family History Family History  Problem Relation Age of Onset   Hypertension Mother    Heart failure Mother     Social History Social History   Tobacco Use   Smoking status: Every Day    Packs/day: 0.25    Years: 9.00    Pack years: 2.25    Types: Cigarettes   Smokeless tobacco: Never  Substance Use Topics   Alcohol use: No   Drug use: Yes    Types: Marijuana    Comment: twice a week      Allergies   Ritalin [methylphenidate hcl]   Review of Systems Review of Systems Per HPI  Physical Exam Triage Vital Signs ED Triage Vitals [04/03/21 0949]  Enc Vitals Group     BP (!) 170/99     Pulse Rate 91     Resp 16     Temp 99.1 F (37.3 C)     Temp Source Oral     SpO2 97 %     Weight      Height      Head Circumference      Peak Flow  Pain Score 10     Pain Loc      Pain Edu?      Excl. in GC?    No data found.  Updated Vital Signs BP (!) 170/99 (BP Location: Left Arm)   Pulse 91   Temp 99.1 F (37.3 C) (Oral)   Resp 16   SpO2 97%   Visual Acuity Right Eye Distance:   Left Eye Distance:   Bilateral Distance:    Right Eye Near:   Left Eye Near:    Bilateral Near:     Physical Exam Constitutional:      General: She is not in acute distress.    Appearance: Normal appearance. She is not toxic-appearing or diaphoretic.  HENT:     Head: Normocephalic and atraumatic.     Right Ear: Tympanic membrane and ear canal normal.     Left Ear: Tympanic membrane and ear canal normal.     Nose: Congestion present.     Mouth/Throat:     Mouth: Mucous membranes are moist.     Pharynx: No posterior oropharyngeal erythema.  Eyes:     Extraocular  Movements: Extraocular movements intact.     Conjunctiva/sclera: Conjunctivae normal.     Pupils: Pupils are equal, round, and reactive to light.  Cardiovascular:     Rate and Rhythm: Normal rate and regular rhythm.     Pulses: Normal pulses.     Heart sounds: Normal heart sounds.  Pulmonary:     Effort: Pulmonary effort is normal. No respiratory distress.     Breath sounds: No stridor. Rhonchi present. No wheezing.  Abdominal:     General: Abdomen is flat. Bowel sounds are normal.     Palpations: Abdomen is soft.  Musculoskeletal:        General: Normal range of motion.     Cervical back: Normal range of motion.  Skin:    General: Skin is warm and dry.  Neurological:     General: No focal deficit present.     Mental Status: She is alert and oriented to person, place, and time. Mental status is at baseline.  Psychiatric:        Mood and Affect: Mood normal.        Behavior: Behavior normal.     UC Treatments / Results  Labs (all labs ordered are listed, but only abnormal results are displayed) Labs Reviewed  NOVEL CORONAVIRUS, NAA    EKG   Radiology DG Chest 2 View  Result Date: 04/03/2021 CLINICAL DATA:  Cough, fever EXAM: CHEST - 2 VIEW COMPARISON:  10/07/2016 FINDINGS: The heart size and mediastinal contours are within normal limits. No focal airspace consolidation, pleural effusion, or pneumothorax. The visualized skeletal structures are unremarkable. IMPRESSION: No active cardiopulmonary disease. Electronically Signed   By: Duanne Guess D.O.   On: 04/03/2021 10:58    Procedures Procedures (including critical care time)  Medications Ordered in UC Medications - No data to display  Initial Impression / Assessment and Plan / UC Course  I have reviewed the triage vital signs and the nursing notes.  Pertinent labs & imaging results that were available during my care of the patient were reviewed by me and considered in my medical decision making (see chart for  details).     Chest x-ray was negative for any acute cardiopulmonary process.  Will treat with prednisone x5 days.  Benzonatate to take as needed for cough.  Discussed supportive care with patient.  No red flags seen  on exam.  Patient is nontoxic-appearing and does not appear to be in need of immediate medical attention at the hospital at this time.  Advised to restart blood pressure medication.  No signs of hypertensive urgency at this time.Discussed strict return precautions. Patient verbalized understanding and is agreeable with plan.  Final Clinical Impressions(s) / UC Diagnoses   Final diagnoses:  Viral upper respiratory tract infection with cough  Encounter for laboratory testing for COVID-19 virus     Discharge Instructions      X-ray was negative.  You have a viral upper respiratory infection that is causing your symptoms.  This should resolve in the next few days with supportive care.  You have been prescribed prednisone steroid to decrease inflammation in chest and help alleviate cough.  You have also been prescribed cough medication to take as needed.  COVID-19 test is pending.  We will call if it is positive.  Please restart your blood pressure medications as well.     ED Prescriptions     Medication Sig Dispense Auth. Provider   predniSONE (DELTASONE) 20 MG tablet Take 2 tablets (40 mg total) by mouth daily for 5 days. 10 tablet Lance Muss, FNP   benzonatate (TESSALON) 100 MG capsule Take 1 capsule (100 mg total) by mouth every 8 (eight) hours as needed for cough. 21 capsule Lance Muss, FNP      PDMP not reviewed this encounter.   Lance Muss, FNP 04/03/21 1126

## 2021-04-03 NOTE — Telephone Encounter (Signed)
No answer when I tried to call the patient at 550pm  She should NOT take double her BP med  From record it appears she is out of her amlodipine  I am resending a refill on the amlodipine to her pharmacy and also adding valsartan daily  She will need an OV with me soon in the next week ok to double

## 2021-04-03 NOTE — Addendum Note (Signed)
Addended by: Shan Levans E on: 04/03/2021 05:53 PM   Modules accepted: Orders

## 2021-04-04 LAB — SARS-COV-2, NAA 2 DAY TAT

## 2021-04-04 LAB — NOVEL CORONAVIRUS, NAA: SARS-CoV-2, NAA: NOT DETECTED

## 2021-04-05 NOTE — Telephone Encounter (Signed)
Pt was called and VM was left informing patient to return phone call. ?

## 2021-05-20 NOTE — Progress Notes (Signed)
Established Patient Office Visit  Subjective:  Patient ID: Sarah Small, female    DOB: Aug 23, 1990  Age: 30 y.o. MRN: [id number]  CC:  Chief Complaint  Patient presents with   Hypertension   Medication Refill    HPI Sarah Small presents for primary care follow-up.  Note patient's been out of blood pressure medicines for 2 months and blood pressure at home is running 190/100.  On arrival she is 175/100.  Patient is not compliant with her Dash diet eating lots of fast food and processed foods.  She also notes increased snoring and her uvula gets caught in the back of her throat is extremely long.  Her cigarette use has been reduced from 2 to 3 packs a day to 10 cigarettes daily.  Patient has no other real complaints at this time.  She does have excess snoring and excess hypersomnolence during the daytime.  Past Medical History:  Diagnosis Date   Hypertension     History reviewed. No pertinent surgical history.  Family History  Problem Relation Age of Onset   Hypertension Mother    Heart failure Mother     Social History   Socioeconomic History   Marital status: Single    Spouse name: Not on file   Number of children: Not on file   Years of education: Not on file   Highest education level: Not on file  Occupational History   Not on file  Tobacco Use   Smoking status: Every Day    Packs/day: 0.25    Years: 9.00    Pack years: 2.25    Types: Cigarettes   Smokeless tobacco: Never  Substance and Sexual Activity   Alcohol use: No   Drug use: Yes    Types: Marijuana    Comment: twice a week    Sexual activity: Yes  Other Topics Concern   Not on file  Social History Narrative   ** Merged History Encounter **       Social Determinants of Health   Financial Resource Strain: Not on file  Food Insecurity: Not on file  Transportation Needs: Not on file  Physical Activity: Not on file  Stress: Not on file  Social Connections: Not on file  Intimate Partner  Violence: Not on file    Outpatient Medications Prior to Visit  Medication Sig Dispense Refill   amLODipine (NORVASC) 10 MG tablet TAKE 1 TABLET (10 MG TOTAL) BY MOUTH DAILY. (Patient not taking: Reported on 05/21/2021) 90 tablet 3   benzonatate (TESSALON) 100 MG capsule Take 1 capsule (100 mg total) by mouth every 8 (eight) hours as needed for cough. (Patient not taking: Reported on 05/21/2021) 21 capsule 0   valsartan (DIOVAN) 160 MG tablet Take 1 tablet (160 mg total) by mouth daily. (Patient not taking: Reported on 05/21/2021) 90 tablet 3   No facility-administered medications prior to visit.    Allergies  Allergen Reactions   Ritalin [Methylphenidate Hcl] Rash    ROS Review of Systems  Constitutional:  Positive for fatigue. Negative for chills, diaphoresis and fever.  HENT:  Negative for congestion, hearing loss, nosebleeds, sore throat and tinnitus.        Snoring  Eyes:  Negative for photophobia and redness.  Respiratory:  Negative for cough, shortness of breath, wheezing and stridor.   Cardiovascular:  Negative for chest pain, palpitations and leg swelling.  Gastrointestinal:  Negative for abdominal pain, blood in stool, constipation, diarrhea, nausea and vomiting.  Endocrine: Negative for polydipsia.  Genitourinary:  Negative for dysuria, flank pain, frequency, hematuria and urgency.  Musculoskeletal:  Negative for back pain, myalgias and neck pain.  Skin:  Negative for rash.  Allergic/Immunologic: Negative for environmental allergies.  Neurological:  Negative for dizziness, tremors, seizures, weakness and headaches.  Hematological:  Does not bruise/bleed easily.  Psychiatric/Behavioral:  Negative for suicidal ideas. The patient is not nervous/anxious.      Objective:    Physical Exam Vitals reviewed.  Constitutional:      Appearance: Normal appearance. She is well-developed. She is obese. She is not diaphoretic.  HENT:     Head: Normocephalic and atraumatic.      Nose: Nose normal. No nasal deformity, septal deviation, mucosal edema or rhinorrhea.     Right Sinus: No maxillary sinus tenderness or frontal sinus tenderness.     Left Sinus: No maxillary sinus tenderness or frontal sinus tenderness.     Mouth/Throat:     Mouth: Mucous membranes are moist.     Pharynx: Oropharynx is clear. No oropharyngeal exudate.     Comments: Extremely elongated uvula Eyes:     General: No scleral icterus.    Conjunctiva/sclera: Conjunctivae normal.     Pupils: Pupils are equal, round, and reactive to light.  Neck:     Thyroid: No thyromegaly.     Vascular: No carotid bruit or JVD.     Trachea: Trachea normal. No tracheal tenderness or tracheal deviation.  Cardiovascular:     Rate and Rhythm: Normal rate and regular rhythm.     Chest Wall: PMI is not displaced.     Pulses: Normal pulses. No decreased pulses.     Heart sounds: Normal heart sounds, S1 normal and S2 normal. Heart sounds not distant. No murmur heard. No systolic murmur is present.  No diastolic murmur is present.    No friction rub. No gallop. No S3 or S4 sounds.  Pulmonary:     Effort: No tachypnea, accessory muscle usage or respiratory distress.     Breath sounds: No stridor. No decreased breath sounds, wheezing, rhonchi or rales.  Chest:     Chest wall: No tenderness.  Abdominal:     General: Bowel sounds are normal. There is no distension.     Palpations: Abdomen is soft. Abdomen is not rigid.     Tenderness: There is no abdominal tenderness. There is no guarding or rebound.  Musculoskeletal:        General: Normal range of motion.     Cervical back: Normal range of motion and neck supple. No edema, erythema or rigidity. No muscular tenderness. Normal range of motion.  Lymphadenopathy:     Head:     Right side of head: No submental or submandibular adenopathy.     Left side of head: No submental or submandibular adenopathy.     Cervical: No cervical adenopathy.  Skin:    General: Skin  is warm and dry.     Coloration: Skin is not pale.     Findings: No rash.     Nails: There is no clubbing.  Neurological:     Mental Status: She is alert and oriented to person, place, and time.     Sensory: No sensory deficit.  Psychiatric:        Mood and Affect: Mood normal.        Speech: Speech normal.        Behavior: Behavior normal.        Thought Content: Thought content normal.  Judgment: Judgment normal.    BP (!) 143/99   Pulse 91   Resp 16   Wt 196 lb 3.2 oz (89 kg)   SpO2 99%   BMI 34.76 kg/m  Wt Readings from Last 3 Encounters:  05/21/21 196 lb 3.2 oz (89 kg)  07/21/20 193 lb 12.8 oz (87.9 kg)  06/29/20 196 lb (88.9 kg)     Health Maintenance Due  Topic Date Due   COVID-19 Vaccine (2 - Moderna series) 02/14/2020   INFLUENZA VACCINE  Never done    There are no preventive care reminders to display for this patient.  Lab Results  Component Value Date   TSH 1.130 07/21/2020   Lab Results  Component Value Date   WBC 6.9 07/21/2020   HGB 12.1 07/21/2020   HCT 37.9 07/21/2020   MCV 79 07/21/2020   PLT 254 07/21/2020   Lab Results  Component Value Date   NA 138 01/17/2020   K 3.8 01/17/2020   CO2 22 01/17/2020   GLUCOSE 86 01/17/2020   BUN 10 01/17/2020   CREATININE 0.82 01/17/2020   BILITOT <0.2 01/17/2020   ALKPHOS 85 01/17/2020   AST 16 01/17/2020   ALT 13 01/17/2020   PROT 6.2 01/17/2020   ALBUMIN 3.9 01/17/2020   CALCIUM 8.9 01/17/2020   ANIONGAP 10 10/07/2016   No results found for: CHOL No results found for: HDL No results found for: LDLCALC No results found for: TRIG No results found for: CHOLHDL No results found for: HGBA1C    Assessment & Plan:   Problem List Items Addressed This Visit       Cardiovascular and Mediastinum   Essential hypertension    Hypertension not well controlled  Change to valsartan HCT 320/25 daily Continue amlodipine 10 mg daily Had Coreg 12.5 mg twice daily  Return in short-term  follow-up 6 weeks      Relevant Medications   valsartan-hydrochlorothiazide (DIOVAN-HCT) 320-25 MG tablet   amLODipine (NORVASC) 10 MG tablet   carvedilol (COREG) 12.5 MG tablet     Respiratory   OSA (obstructive sleep apnea) - Primary    Symptom complex most consistent with severe sleep apnea we will arrange for home sleep study      Relevant Orders   Home sleep test   Ambulatory referral to ENT     Other   Tobacco dependency       Current smoking consumption amount:10cigarettes per day  Dicsussion on advise to quit smoking and smoking impacts: Cardiovascular health impacts  Patient's willingness to quit: Willing to consider quitting  Methods to quit smoking discussed: Behavioral modification  Medication management of smoking session drugs discussed: Nicotine replacement therapy  Resources provided:  AVS   Setting quit date not determined  Follow-up arranged 6 weeks  Time spent counseling the patient: 5 minutes       Relevant Medications   nicotine polacrilex (NICORETTE MINI) 4 MG lozenge   Long uvula    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    Dispense:  90 tablet    Refill:  3   amLODipine (NORVASC) 10 MG tablet    Sig: TAKE 1 TABLET (10 MG TOTAL) BY MOUTH DAILY.    Dispense:  90 tablet    Refill:  3   carvedilol (COREG) 12.5 MG tablet    Sig: Take 1 tablet (12.5 mg total) by mouth 2 (two) times daily with a  meal.    Dispense:  60 tablet    Refill:  3     Follow-up: Return in about 6 weeks (around 07/02/2021).    Asencion Noble, MD

## 2021-05-21 ENCOUNTER — Encounter: Payer: Self-pay | Admitting: Critical Care Medicine

## 2021-05-21 ENCOUNTER — Other Ambulatory Visit: Payer: Self-pay

## 2021-05-21 ENCOUNTER — Ambulatory Visit: Payer: 59 | Attending: Critical Care Medicine | Admitting: Critical Care Medicine

## 2021-05-21 VITALS — BP 143/99 | HR 91 | Resp 16 | Wt 196.2 lb

## 2021-05-21 DIAGNOSIS — Z23 Encounter for immunization: Secondary | ICD-10-CM

## 2021-05-21 DIAGNOSIS — G4733 Obstructive sleep apnea (adult) (pediatric): Secondary | ICD-10-CM | POA: Insufficient documentation

## 2021-05-21 DIAGNOSIS — F17210 Nicotine dependence, cigarettes, uncomplicated: Secondary | ICD-10-CM

## 2021-05-21 DIAGNOSIS — Q385 Congenital malformations of palate, not elsewhere classified: Secondary | ICD-10-CM | POA: Diagnosis not present

## 2021-05-21 DIAGNOSIS — I10 Essential (primary) hypertension: Secondary | ICD-10-CM | POA: Diagnosis not present

## 2021-05-21 DIAGNOSIS — F17200 Nicotine dependence, unspecified, uncomplicated: Secondary | ICD-10-CM

## 2021-05-21 MED ORDER — CARVEDILOL 12.5 MG PO TABS: 12.5000 mg | ORAL_TABLET | Freq: Two times a day (BID) | ORAL | 3 refills | Status: DC

## 2021-05-21 MED ORDER — VALSARTAN-HYDROCHLOROTHIAZIDE 320-25 MG PO TABS: 1.0000 | ORAL_TABLET | Freq: Every day | ORAL | 3 refills | Status: DC

## 2021-05-21 MED ORDER — AMLODIPINE BESYLATE 10 MG PO TABS: ORAL_TABLET | Freq: Every day | ORAL | 3 refills | Status: DC

## 2021-05-21 MED ORDER — NICOTINE POLACRILEX 4 MG MT LOZG: LOZENGE | OROMUCOSAL | 4 refills | Status: DC

## 2021-05-21 NOTE — Assessment & Plan Note (Signed)
Symptom complex most consistent with severe sleep apnea we will arrange for home sleep study

## 2021-05-21 NOTE — Assessment & Plan Note (Signed)
  .   Current smoking consumption amount:10cigarettes per day  . Dicsussion on advise to quit smoking and smoking impacts: Cardiovascular health impacts  . Patient's willingness to quit: Willing to consider quitting  . Methods to quit smoking discussed: Behavioral modification  . Medication management of smoking session drugs discussed: Nicotine replacement therapy  . Resources provided:  AVS   . Setting quit date not determined  Follow-up arranged 6 weeks  Time spent counseling the patient: 5 minutes

## 2021-05-21 NOTE — Assessment & Plan Note (Signed)
Hypertension not well controlled  Change to valsartan HCT 320/25 daily Continue amlodipine 10 mg daily Had Coreg 12.5 mg twice daily  Return in short-term follow-up 6 weeks

## 2021-05-21 NOTE — Patient Instructions (Signed)
A home sleep study will be arranged  Referral to ENT to evaluate your uvula will be obtained  Change valsartan to valsartan HCT 1 daily this was sent to your pharmacy  Begin Coreg/ carvedilol 1 tablet twice daily this was sent to your pharmacy  Stay on amlodipine 1 daily refill sent to your pharmacy  Stop smoking and use nicotine replacement lozenge 4 mg 3 times daily  You do not need a flu vaccine as you just got over the flu  Return to see Dr. Delford Field 6 weeks

## 2021-05-21 NOTE — Assessment & Plan Note (Signed)
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

## 2021-07-21 NOTE — Progress Notes (Deleted)
Established Patient Office Visit  Subjective:  Patient ID: Sarah Small, female    DOB: 1990-06-27  Age: 31 y.o. MRN: [id number]  CC: No chief complaint on file.   HPI Sarah Small presents for   Essential hypertension       Hypertension not well controlled   Change to valsartan HCT 320/25 daily Continue amlodipine 10 mg daily Had Coreg 12.5 mg twice daily   Return in short-term follow-up 6 weeks        Relevant Medications    valsartan-hydrochlorothiazide (DIOVAN-HCT) 320-25 MG tablet    amLODipine (NORVASC) 10 MG tablet    carvedilol (COREG) 12.5 MG tablet        Respiratory    OSA (obstructive sleep apnea) - Primary      Symptom complex most consistent with severe sleep apnea we will arrange for home sleep study        Relevant Orders    Home sleep test    Ambulatory referral to ENT        Other    Tobacco dependency             Past Medical History:  Diagnosis Date   Hypertension     No past surgical history on file.  Family History  Problem Relation Age of Onset   Hypertension Mother    Heart failure Mother     Social History   Socioeconomic History   Marital status: Single    Spouse name: Not on file   Number of children: Not on file   Years of education: Not on file   Highest education level: Not on file  Occupational History   Not on file  Tobacco Use   Smoking status: Every Day    Packs/day: 0.25    Years: 9.00    Pack years: 2.25    Types: Cigarettes   Smokeless tobacco: Never  Substance and Sexual Activity   Alcohol use: No   Drug use: Yes    Types: Marijuana    Comment: twice a week    Sexual activity: Yes  Other Topics Concern   Not on file  Social History Narrative   ** Merged History Encounter **       Social Determinants of Health   Financial Resource Strain: Not on file  Food Insecurity: Not on file  Transportation Needs: Not on file  Physical Activity: Not on file  Stress: Not on file  Social  Connections: Not on file  Intimate Partner Violence: Not on file    Outpatient Medications Prior to Visit  Medication Sig Dispense Refill   amLODipine (NORVASC) 10 MG tablet TAKE 1 TABLET (10 MG TOTAL) BY MOUTH DAILY. 90 tablet 3   carvedilol (COREG) 12.5 MG tablet Take 1 tablet (12.5 mg total) by mouth 2 (two) times daily with a meal. 60 tablet 3   nicotine polacrilex (NICORETTE MINI) 4 MG lozenge Use three times a day to quit smoking 100 tablet 4   valsartan-hydrochlorothiazide (DIOVAN-HCT) 320-25 MG tablet Take 1 tablet by mouth daily. 90 tablet 3   No facility-administered medications prior to visit.    Allergies  Allergen Reactions   Ritalin [Methylphenidate Hcl] Rash    ROS Review of Systems    Objective:    Physical Exam  There were no vitals taken for this visit. Wt Readings from Last 3 Encounters:  05/21/21 196 lb 3.2 oz (89 kg)  07/21/20 193 lb 12.8 oz (87.9 kg)  06/29/20 196 lb (88.9 kg)  Health Maintenance Due  Topic Date Due   COVID-19 Vaccine (2 - Moderna series) 02/14/2020    There are no preventive care reminders to display for this patient.  Lab Results  Component Value Date   TSH 1.130 07/21/2020   Lab Results  Component Value Date   WBC 6.9 07/21/2020   HGB 12.1 07/21/2020   HCT 37.9 07/21/2020   MCV 79 07/21/2020   PLT 254 07/21/2020   Lab Results  Component Value Date   NA 138 01/17/2020   K 3.8 01/17/2020   CO2 22 01/17/2020   GLUCOSE 86 01/17/2020   BUN 10 01/17/2020   CREATININE 0.82 01/17/2020   BILITOT <0.2 01/17/2020   ALKPHOS 85 01/17/2020   AST 16 01/17/2020   ALT 13 01/17/2020   PROT 6.2 01/17/2020   ALBUMIN 3.9 01/17/2020   CALCIUM 8.9 01/17/2020   ANIONGAP 10 10/07/2016   No results found for: CHOL No results found for: HDL No results found for: LDLCALC No results found for: TRIG No results found for: CHOLHDL No results found for: HGBA1C    Assessment & Plan:   Problem List Items Addressed This Visit    None   No orders of the defined types were placed in this encounter.   Follow-up: No follow-ups on file.    Asencion Noble, MD

## 2021-07-22 ENCOUNTER — Ambulatory Visit (HOSPITAL_COMMUNITY)
Admission: EM | Admit: 2021-07-22 | Discharge: 2021-07-22 | Disposition: A | Discharge status: Home / Self Care | Payer: 59 | Attending: Family Medicine | Admitting: Family Medicine

## 2021-07-22 ENCOUNTER — Other Ambulatory Visit: Payer: Self-pay

## 2021-07-22 ENCOUNTER — Ambulatory Visit (INDEPENDENT_AMBULATORY_CARE_PROVIDER_SITE_OTHER): Payer: 59

## 2021-07-22 ENCOUNTER — Encounter (HOSPITAL_COMMUNITY): Payer: Self-pay | Admitting: Emergency Medicine

## 2021-07-22 DIAGNOSIS — R0789 Other chest pain: Secondary | ICD-10-CM | POA: Diagnosis not present

## 2021-07-22 MED ORDER — ALBUTEROL SULFATE HFA 108 (90 BASE) MCG/ACT IN AERS: 1.0000 | INHALATION_SPRAY | Freq: Four times a day (QID) | RESPIRATORY_TRACT | 0 refills | Status: DC | PRN

## 2021-07-22 NOTE — ED Provider Notes (Signed)
[facility]    CSN: [id number] Arrival date & time: 07/22/21  1048      History   Chief Complaint Chief Complaint  Patient presents with   Toxic Inhalation    HPI Sarah Small is a 31 y.o. female.   31 year old female that presents with mild SOB and chest tightness after inhaling some fire extinguisher particles. This occurred last night. Per partner she was having mild trouble breathing last night. Dry cough.     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 No pertinent surgical history.  OB History   No obstetric history on file.      Home Medications    Prior to Admission medications   Medication Sig Start Date End Date Taking? Authorizing Provider  albuterol (VENTOLIN HFA) 108 (90 Base) MCG/ACT inhaler Inhale 1-2 puffs into the lungs every 6 (six) hours as needed for wheezing or shortness of breath. 07/22/21  Yes Rocklin Soderquist A, NP  amLODipine (NORVASC) 10 MG tablet TAKE 1 TABLET (10 MG TOTAL) BY MOUTH DAILY. 05/21/21 05/21/22  Elsie Stain, MD  carvedilol (COREG) 12.5 MG tablet Take 1 tablet (12.5 mg total) by mouth 2 (two) times daily with a meal. 05/21/21   Elsie Stain, MD  nicotine polacrilex (NICORETTE MINI) 4 MG lozenge Use three times a day to quit smoking 05/21/21   Elsie Stain, MD  valsartan-hydrochlorothiazide (DIOVAN-HCT) 320-25 MG tablet Take 1 tablet by mouth daily. 05/21/21   Elsie Stain, MD    Family History Family History  Problem Relation Age of Onset   Hypertension Mother    Heart failure Mother     Social  History Social History   Tobacco Use   Smoking status: Every Day    Packs/day: 0.25    Years: 9.00    Pack years: 2.25    Types: Cigarettes   Smokeless tobacco: Never  Substance Use Topics   Alcohol use: No   Drug use: Yes    Types: Marijuana    Comment: twice a week      Allergies   Ritalin [methylphenidate hcl]   Review of Systems Review of Systems   Physical Exam Triage Vital Signs ED Triage Vitals  Enc Vitals Group     BP 07/22/21 1056 (!) 150/73     Pulse Rate 07/22/21 1056 63     Resp 07/22/21 1056 18     Temp 07/22/21 1056 98.4 F (36.9 C)     Temp Source 07/22/21 1056 Oral     SpO2 07/22/21 1056 100 %     Weight --      Height --      Head Circumference --      Peak Flow --      Pain Score 07/22/21 1057 8     Pain Loc --      Pain Edu? --      Excl. in [place]? --    No data found.  Updated Vital Signs BP (!) 150/73 (BP Location: Right Arm)    Pulse 63    Temp 98.4 F (36.9  C) (Oral)    Resp 18    LMP 07/07/2021    SpO2 100%   Visual Acuity Right Eye Distance:   Left Eye Distance:   Bilateral Distance:    Right Eye Near:   Left Eye Near:    Bilateral Near:     Physical Exam Vitals and nursing note reviewed.  Constitutional:      General: She is not in acute distress.    Appearance: Normal appearance. She is not ill-appearing, toxic-appearing or diaphoretic.  Cardiovascular:     Rate and Rhythm: Normal rate and regular rhythm.     Pulses: Normal pulses.     Heart sounds: Normal heart sounds.  Pulmonary:     Effort: Pulmonary effort is normal.     Breath sounds: Normal breath sounds.  Skin:    General: Skin is warm and dry.  Neurological:     Mental Status: She is alert.     UC Treatments / Results  Labs (all labs ordered are listed, but only abnormal results are displayed) Labs Reviewed - No data to display  EKG   Radiology DG Chest 2 View  Result Date: 07/22/2021 CLINICAL DATA:  31 year old female with possible inhalation  injury. EXAM: CHEST - 2 VIEW COMPARISON:  04/03/2021 chest radiographs and earlier. FINDINGS: Lung volumes and mediastinal contours remain normal. Visualized tracheal air column is within normal limits. The lungs appear stable and clear. No pneumothorax or pleural effusion. Negative visible bowel gas and osseous structures. IMPRESSION: Negative.  No cardiopulmonary abnormality. Electronically Signed   By: Genevie Ann M.D.   On: 07/22/2021 12:14    Procedures Procedures (including critical care time)  Medications Ordered in UC Medications - No data to display  Initial Impression / Assessment and Plan / UC Course  I have reviewed the triage vital signs and the nursing notes.  Pertinent labs & imaging results that were available during my care of the patient were reviewed by me and considered in my medical decision making (see chart for details).     Chest tightness post inhalation- x ray normal here today. Most likely just some inflammation that will resolve in a few days. Use the inhaler as needed  Work note provided  Final Clinical Impressions(s) / UC Diagnoses   Final diagnoses:  Chest tightness     Discharge Instructions      You x ray was normal I believe that you have some inflammation in the lungs from the stuff you inhaled. It may take some time to improve.  I am prescribing you an inhaler that you can use as needed for cough, wheezing or shortness of breath.  If your symptoms worsen please return.      ED Prescriptions     Medication Sig Dispense Auth. Provider   albuterol (VENTOLIN HFA) 108 (90 Base) MCG/ACT inhaler Inhale 1-2 puffs into the lungs every 6 (six) hours as needed for wheezing or shortness of breath. 1 each Orvan July, NP      PDMP not reviewed this encounter.   Loura Halt A, NP 07/22/21 1240

## 2021-07-22 NOTE — Discharge Instructions (Signed)
You x ray was normal I believe that you have some inflammation in the lungs from the stuff you inhaled. It may take some time to improve.  I am prescribing you an inhaler that you can use as needed for cough, wheezing or shortness of breath.  If your symptoms worsen please return.

## 2021-07-22 NOTE — ED Triage Notes (Signed)
Pt works at [employer] and yesterday when got a call there was Government social research officer substance everywhere. She was told to clean it up. Tried sweeping with broom, then tried pouring water on it to wash down stairs but was told by coworker to use leaf blower to blow away. Pt inhaled the particles. Reports chest is hurting.

## 2021-07-23 ENCOUNTER — Ambulatory Visit: Payer: 59 | Attending: Critical Care Medicine | Admitting: Critical Care Medicine

## 2021-07-23 ENCOUNTER — Encounter: Payer: Self-pay | Admitting: Critical Care Medicine

## 2021-07-23 VITALS — BP 160/100 | HR 50 | Resp 16 | Wt 199.8 lb

## 2021-07-23 DIAGNOSIS — F17200 Nicotine dependence, unspecified, uncomplicated: Secondary | ICD-10-CM

## 2021-07-23 DIAGNOSIS — R002 Palpitations: Secondary | ICD-10-CM

## 2021-07-23 DIAGNOSIS — Q385 Congenital malformations of palate, not elsewhere classified: Secondary | ICD-10-CM

## 2021-07-23 DIAGNOSIS — M542 Cervicalgia: Secondary | ICD-10-CM

## 2021-07-23 DIAGNOSIS — G4733 Obstructive sleep apnea (adult) (pediatric): Secondary | ICD-10-CM | POA: Diagnosis not present

## 2021-07-23 DIAGNOSIS — I10 Essential (primary) hypertension: Secondary | ICD-10-CM | POA: Diagnosis not present

## 2021-07-23 DIAGNOSIS — K219 Gastro-esophageal reflux disease without esophagitis: Secondary | ICD-10-CM

## 2021-07-23 DIAGNOSIS — Q386 Other congenital malformations of mouth: Secondary | ICD-10-CM

## 2021-07-23 MED ORDER — PANTOPRAZOLE SODIUM 40 MG PO TBEC: 40.0000 mg | DELAYED_RELEASE_TABLET | Freq: Every day | ORAL | 3 refills | Status: DC

## 2021-07-23 MED ORDER — AMLODIPINE BESYLATE 10 MG PO TABS: ORAL_TABLET | Freq: Every day | ORAL | 3 refills | Status: DC

## 2021-07-23 MED ORDER — CARVEDILOL 12.5 MG PO TABS: 12.5000 mg | ORAL_TABLET | Freq: Two times a day (BID) | ORAL | 3 refills | Status: DC

## 2021-07-23 MED ORDER — VALSARTAN 320 MG PO TABS: 320.0000 mg | ORAL_TABLET | Freq: Every day | ORAL | 3 refills | Status: DC

## 2021-07-23 MED ORDER — SPIRONOLACTONE 25 MG PO TABS: 25.0000 mg | ORAL_TABLET | Freq: Every day | ORAL | 3 refills | Status: DC

## 2021-07-23 NOTE — Assessment & Plan Note (Addendum)
Hypertension is not well-controlled at this time despite adherence to medications and lifestyle interventions. Medication adjustments made today to address this. Her Valsartan-HCTZ is already at maximum dosage.Her heart rate was between 49-51 BPM on in-office measurements, so it is not appropriate to increase her Carvedilol at this time. Therefore, she will stop Valsartan-HCTZ 320-25mg  and this will be replaced with Valsartan 320mg  and Spironolactone 25mg . Labs will also be completed today to assess her kidney function via CMP.   Referral was also made to the [facility] run by cardiology due to the difficulty in managing her hypertension. They will also be able to address her palpitation symptoms at that time.   The patient was encouraged to continue with her healthy diet and her lifestyle management of hypertension.

## 2021-07-23 NOTE — Assessment & Plan Note (Signed)
Patient has made excellent progress in decreasing her smoking from 10 cigarettes a day to between 1-3 per day. She is using the nicotine lozenges as well. Patient encouraged to continue with her smoking cessation efforts, as this will likely help with her blood pressure management as well. Education materials regarding smoking cessation provided today.

## 2021-07-23 NOTE — Assessment & Plan Note (Signed)
Patient's throat does show irritation on exam. No epigastric pain was elicited upon palpation of the epigastrum. Education was provided today and instructions were given on decreasing consumption of foods that cause reflux such as acidic fruits, coffee, and fatty foods. A prescription for Protonix was sent to the patient's pharmacy to help with her symptoms.

## 2021-07-23 NOTE — Patient Instructions (Addendum)
We have changed your blood pressure medications today. Please discontinue Valsartan-HCTZ. Instead, you will take Valsartan, and Aldactone. Continue taking Carvediolol and Amlodipine.  We have attached information about GERD and have ordered a medication for this called Protonix.  You will have labs to check your kidney function today.   We have made a referral to the hypertension clinic.  We sent the home sleep study order again.   Continue with your smoking cessation efforts, great job on reducing your cigarette use!

## 2021-07-23 NOTE — Progress Notes (Signed)
Established Patient Office Visit  Subjective:  Patient ID: Sarah Small, female    DOB: 09/21/1990  Age: 31 y.o. MRN: [id number]  CC:  Chief Complaint  Patient presents with   Hypertension    HPI Sarah Small presents for hypertension follow up. She is accompanied by her wife, Sarah Small. She reports that she has been taking her medications as indicated. She has been taking Amlodipine, Valsartan-HCTZ, and Carvedilol. She has been taking her home blood pressures daily, and that they vary significantly. The lowest reading she got was 113/88, and her highest reading was 160/94. She reports that she occasionally has symptoms of her heart racing, fluttering, and even like it stops or skips a beat. She would like referral to cardiology.  Sarah Small has made positive efforts to improve her lifestyle management of her hypertension. She has decreased her smoking to 1-3 cigarettes per day. She has been using nicotine lozenges and feels like they have helped her a lot. She has also made improvements to her diet. She now only occasionally has fast food, instead of multiple times per week. She has incorporated fruits, vegetables, and lean meats into her diet.   Despite her medication adherence and lifestyle modifications, her blood pressure remains elevated. Her first office reading was 149/94, and subsequent reading was 165/101.  Of note, the patient reports that she has had some difficulty breathing, due to an incident at work yesterday, where she was instructed to use a leaf blower to clean up fire extinguisher powder. This resulted in her inhaling some of the powder, which has led to some shortness of breath and chest tightness. She visited urgent care yesterday for this issue, and chest x-ray was done. No acute findings were appreciated at the visit yesterday, and she was prescribed an albuterol inhaler for her shortness of breath. She has not had a chance to pick up the inhaler yet. She typically  does not have shortness of breath.   Her other complaint today is of throat irritation and increased eructation after meals. She has noted foul-smelling breath when this happens. She also has noted increasing neck pain. When she has the neck pain, she also notices headaches.  She has been referred to ENT for evaluation for an enlarged uvula. She has this appointment scheduled for April. She had a referral made for home sleep study in early December, but has not been contacted about this yet. She admits to continued issues with snoring and daytime sleepiness.  Past Medical History:  Diagnosis Date   Hypertension     History reviewed. No pertinent surgical history.  Family History  Problem Relation Age of Onset   Hypertension Mother    Heart failure Mother     Social History   Socioeconomic History   Marital status: Single    Spouse name: Not on file   Number of children: Not on file   Years of education: Not on file   Highest education level: Not on file  Occupational History   Not on file  Tobacco Use   Smoking status: Every Day    Packs/day: 0.25    Years: 9.00    Pack years: 2.25    Types: Cigarettes   Smokeless tobacco: Never  Substance and Sexual Activity   Alcohol use: No   Drug use: Yes    Types: Marijuana    Comment: twice a week    Sexual activity: Yes  Other Topics Concern   Not on file  Social History Narrative   **  Merged History Encounter **       Social Determinants of Health   Financial Resource Strain: Not on file  Food Insecurity: Not on file  Transportation Needs: Not on file  Physical Activity: Not on file  Stress: Not on file  Social Connections: Not on file  Intimate Partner Violence: Not on file    Outpatient Medications Prior to Visit  Medication Sig Dispense Refill   albuterol (VENTOLIN HFA) 108 (90 Base) MCG/ACT inhaler Inhale 1-2 puffs into the lungs every 6 (six) hours as needed for wheezing or shortness of breath. 1 each 0    nicotine polacrilex (NICORETTE MINI) 4 MG lozenge Use three times a day to quit smoking 100 tablet 4   amLODipine (NORVASC) 10 MG tablet TAKE 1 TABLET (10 MG TOTAL) BY MOUTH DAILY. 90 tablet 3   carvedilol (COREG) 12.5 MG tablet Take 1 tablet (12.5 mg total) by mouth 2 (two) times daily with a meal. 60 tablet 3   valsartan-hydrochlorothiazide (DIOVAN-HCT) 320-25 MG tablet Take 1 tablet by mouth daily. 90 tablet 3   No facility-administered medications prior to visit.    Allergies  Allergen Reactions   Ritalin [Methylphenidate Hcl] Rash    ROS Review of Systems  HENT: Negative.    Eyes: Negative.   Respiratory:  Positive for apnea (snoring), chest tightness and shortness of breath.   Cardiovascular:  Positive for chest pain and palpitations. Negative for leg swelling.  Gastrointestinal:  Negative for constipation, diarrhea and vomiting. Abdominal distention: bloating. Genitourinary:  Positive for frequency (from BP medication).  Musculoskeletal:  Positive for neck pain.  Skin: Negative.   Neurological:  Positive for headaches. Negative for dizziness and weakness.  Psychiatric/Behavioral:  Positive for sleep disturbance.      Objective:    Physical Exam Constitutional:      Appearance: Normal appearance. She is obese.  HENT:     Head: Normocephalic and atraumatic.     Right Ear: External ear normal.     Left Ear: External ear normal.     Mouth/Throat:     Mouth: Mucous membranes are moist.     Pharynx: Oropharynx is clear. Posterior oropharyngeal erythema present. Uvula swelling: uvula notably elongated. Cardiovascular:     Rate and Rhythm: Normal rate and regular rhythm.     Pulses: Normal pulses.     Heart sounds: Normal heart sounds. No murmur heard.   No friction rub. No gallop.  Pulmonary:     Effort: Pulmonary effort is normal.     Breath sounds: Normal breath sounds. No wheezing, rhonchi or rales.  Musculoskeletal:     Cervical back: Full passive range of motion  without pain and normal range of motion. Muscular tenderness present.     Thoracic back: Tenderness present.  Skin:    General: Skin is warm and dry.  Neurological:     General: No focal deficit present.     Mental Status: She is alert and oriented to person, place, and time.  Psychiatric:        Mood and Affect: Mood normal.        Behavior: Behavior normal.        Thought Content: Thought content normal.        Judgment: Judgment normal.    BP (!) 160/100    Pulse (!) 50    Resp 16    Wt 199 lb 12.8 oz (90.6 kg)    LMP 07/07/2021    SpO2 100%    BMI 35.39  kg/m  Wt Readings from Last 3 Encounters:  07/23/21 199 lb 12.8 oz (90.6 kg)  05/21/21 196 lb 3.2 oz (89 kg)  07/21/20 193 lb 12.8 oz (87.9 kg)     Health Maintenance Due  Topic Date Due   COVID-19 Vaccine (2 - Moderna series) 02/14/2020    There are no preventive care reminders to display for this patient.  Lab Results  Component Value Date   TSH 1.130 07/21/2020   Lab Results  Component Value Date   WBC 6.9 07/21/2020   HGB 12.1 07/21/2020   HCT 37.9 07/21/2020   MCV 79 07/21/2020   PLT 254 07/21/2020   Lab Results  Component Value Date   NA 138 01/17/2020   K 3.8 01/17/2020   CO2 22 01/17/2020   GLUCOSE 86 01/17/2020   BUN 10 01/17/2020   CREATININE 0.82 01/17/2020   BILITOT <0.2 01/17/2020   ALKPHOS 85 01/17/2020   AST 16 01/17/2020   ALT 13 01/17/2020   PROT 6.2 01/17/2020   ALBUMIN 3.9 01/17/2020   CALCIUM 8.9 01/17/2020   ANIONGAP 10 10/07/2016   No results found for: CHOL No results found for: HDL No results found for: LDLCALC No results found for: TRIG No results found for: CHOLHDL No results found for: GNOI3B    Assessment & Plan:   Problem List Items Addressed This Visit       Cardiovascular and Mediastinum   Essential hypertension    Hypertension is not well-controlled at this time despite adherence to medications and lifestyle interventions. Medication adjustments made today to  address this. Her Valsartan-HCTZ is already at maximum dosage.Her heart rate was between 49-51 BPM on in-office measurements, so it is not appropriate to increase her Carvedilol at this time. Therefore, she will stop Valsartan-HCTZ 320-25mg  and this will be replaced with Valsartan 320mg  and Spironolactone 25mg . Labs will also be completed today to assess her kidney function via CMP.   Referral was also made to the [facility] run by cardiology due to the difficulty in managing her hypertension. They will also be able to address her palpitation symptoms at that time.   The patient was encouraged to continue with her healthy diet and her lifestyle management of hypertension.       Relevant Medications   valsartan (DIOVAN) 320 MG tablet   amLODipine (NORVASC) 10 MG tablet   carvedilol (COREG) 12.5 MG tablet   spironolactone (ALDACTONE) 25 MG tablet   Other Relevant Orders   AMB REFERRAL TO [facility]     Respiratory   OSA (obstructive sleep apnea)    Unfortunately, the patient was never contacted for home sleep study and has not completed this yet. Referral has been made again, as her symptoms are still severe.       Relevant Orders   Home sleep test     Digestive   Gastroesophageal reflux disease without esophagitis    Patient's throat does show irritation on exam. No epigastric pain was elicited upon palpation of the epigastrum. Education was provided today and instructions were given on decreasing consumption of foods that cause reflux such as acidic fruits, coffee, and fatty foods. A prescription for Protonix was sent to the patient's pharmacy to help with her symptoms.       Relevant Medications   pantoprazole (PROTONIX) 40 MG tablet     Other   Tobacco dependency    Patient has made excellent progress in decreasing her smoking from 10 cigarettes a day to  between 1-3 per day. She is using the nicotine lozenges as well. Patient encouraged to continue with  her smoking cessation efforts, as this will likely help with her blood pressure management as well. Education materials regarding smoking cessation provided today.       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 She has an appointment scheduled with ENT for April to have this evaluated. She has been encouraged to keep this appointment.       Palpitations - Primary    Auscultation of the heart was normal upon exam today, with no murmurs, rubs, or gallops noted. Rate and rhythm were normal. However, coupled with her hypertension, further workup is warranted for these symptoms. Referral has been made to cardiology for further management and workup to assess for any valvular or other conductive disease.      Relevant Orders   AMB REFERRAL TO [facility]ANCED [facility]N CLINIC   Neck pain, bilateral    Patient has normal range of motion of the neck, but has pain upon palpation of the neck and upper back. Her trapezius muscle was very tight and stiff. Her posture is poor.   At this point, imaging is not indicated. Recommended exercises to assist with strengthening of the muscles of the neck and upper back, and to improve posture. Education print outs provided to the patient for her to reference.       Meds ordered this encounter  Medications   valsartan (DIOVAN) 320 MG tablet    Sig: Take 1 tablet (320 mg total) by mouth daily.    Dispense:  90 tablet    Refill:  3   amLODipine (NORVASC) 10 MG tablet    Sig: TAKE 1 TABLET (10 MG TOTAL) BY MOUTH DAILY.    Dispense:  90 tablet    Refill:  3   carvedilol (COREG) 12.5 MG tablet    Sig: Take 1 tablet (12.5 mg total) by mouth 2 (two) times daily with a meal.    Dispense:  60 tablet    Refill:  3   spironolactone (ALDACTONE) 25 MG tablet    Sig: Take 1 tablet (25 mg total) by mouth daily.    Dispense:  60 tablet    Refill:  3   pantoprazole (PROTONIX) 40 MG tablet    Sig: Take 1 tablet (40 mg total) by mouth daily.     Dispense:  30 tablet    Refill:  3    Follow-up: Return in about 2 months (around 09/20/2021).    Shan LevansPatrick Jaquez Farrington, MD

## 2021-07-23 NOTE — Assessment & Plan Note (Signed)
Patient's long uvula is still causing throat irritation. She has an appointment scheduled with ENT for April to have this evaluated. She has been encouraged to keep this appointment.

## 2021-07-23 NOTE — Assessment & Plan Note (Signed)
Auscultation of the heart was normal upon exam today, with no murmurs, rubs, or gallops noted. Rate and rhythm were normal. However, coupled with her hypertension, further workup is warranted for these symptoms. Referral has been made to cardiology for further management and workup to assess for any valvular or other conductive disease.

## 2021-07-23 NOTE — Assessment & Plan Note (Signed)
Patient has normal range of motion of the neck, but has pain upon palpation of the neck and upper back. Her trapezius muscle was very tight and stiff. Her posture is poor.   At this point, imaging is not indicated. Recommended exercises to assist with strengthening of the muscles of the neck and upper back, and to improve posture. Education print outs provided to the patient for her to reference.

## 2021-07-23 NOTE — Assessment & Plan Note (Signed)
Unfortunately, the patient was never contacted for home sleep study and has not completed this yet. Referral has been made again, as her symptoms are still severe.

## 2021-10-23 ENCOUNTER — Encounter: Payer: Self-pay | Admitting: Critical Care Medicine

## 2021-10-23 ENCOUNTER — Ambulatory Visit: Payer: 59 | Attending: Critical Care Medicine | Admitting: Critical Care Medicine

## 2021-10-23 VITALS — BP 142/85 | HR 55 | Wt 200.6 lb

## 2021-10-23 DIAGNOSIS — F17200 Nicotine dependence, unspecified, uncomplicated: Secondary | ICD-10-CM

## 2021-10-23 DIAGNOSIS — G4733 Obstructive sleep apnea (adult) (pediatric): Secondary | ICD-10-CM | POA: Diagnosis not present

## 2021-10-23 DIAGNOSIS — K219 Gastro-esophageal reflux disease without esophagitis: Secondary | ICD-10-CM | POA: Diagnosis not present

## 2021-10-23 DIAGNOSIS — Q385 Congenital malformations of palate, not elsewhere classified: Secondary | ICD-10-CM

## 2021-10-23 DIAGNOSIS — R002 Palpitations: Secondary | ICD-10-CM

## 2021-10-23 DIAGNOSIS — I10 Essential (primary) hypertension: Secondary | ICD-10-CM | POA: Diagnosis not present

## 2021-10-23 DIAGNOSIS — M542 Cervicalgia: Secondary | ICD-10-CM

## 2021-10-23 NOTE — Assessment & Plan Note (Signed)
We will order home sleep study yet again ?

## 2021-10-23 NOTE — Assessment & Plan Note (Signed)
Referral to ENT made yet again ?

## 2021-10-23 NOTE — Assessment & Plan Note (Signed)
? ? ??   Current smoking consumption amount: 4 cigarettes daily ? ?? Dicsussion on advise to quit smoking and smoking impacts: Cardiovascular impacts ? ?? Patient's willingness to quit: Wants to quit ? ?? Methods to quit smoking discussed: Nicotine replacement ? ?? Medication management of smoking session drugs discussed: Nicotine replacement ? ?? Resources provided:  AVS  ? ?? Setting quit date not established ? ?? Follow-up arranged 2 months ? ? ?Time spent counseling the patient: 5 minutes ? ?

## 2021-10-23 NOTE — Patient Instructions (Signed)
?  No change in medications ? ?Get the nicotine lozenge and get off the last few cigarettes or smoking daily ? ?We will make another attempt at otolaryngology ENT referral and home sleep study referral ? ?You need to be walking 30 minutes 4-5 times a week ? ?Continue to follow a healthy diet we have previously given you ? ?Please keep your appointment with the advanced hypertension clinic this upcoming ? ?Return to Dr. Delford Field 3 months ?

## 2021-10-23 NOTE — Progress Notes (Signed)
? ?Established Patient Office Visit ? ?Subjective:  ?Patient ID: Sarah Small, female    DOB: Jun 03, 1991  Age: 31 y.o. MRN: [id number] ? ?CC:  ?No chief complaint on file. ? ? ?HPI ?07/2021 ?Sarah CarinaMonique Lausch presents for hypertension follow up. She is accompanied by her wife, Argie Rammingquila. She reports that she has been taking her medications as indicated. She has been taking Amlodipine, Valsartan-HCTZ, and Carvedilol. She has been taking her home blood pressures daily, and that they vary significantly. The lowest reading she got was 113/88, and her highest reading was 160/94. She reports that she occasionally has symptoms of her heart racing, fluttering, and even like it stops or skips a beat. She would like referral to cardiology. ? ?Ms. Casimiro NeedleMichael has made positive efforts to improve her lifestyle management of her hypertension. She has decreased her smoking to 1-3 cigarettes per day. She has been using nicotine lozenges and feels like they have helped her a lot. She has also made improvements to her diet. She now only occasionally has fast food, instead of multiple times per week. She has incorporated fruits, vegetables, and lean meats into her diet.  ? ?Despite her medication adherence and lifestyle modifications, her blood pressure remains elevated. Her first office reading was 149/94, and subsequent reading was 165/101. ? ?Of note, the patient reports that she has had some difficulty breathing, due to an incident at work yesterday, where she was instructed to use a leaf blower to clean up fire extinguisher powder. This resulted in her inhaling some of the powder, which has led to some shortness of breath and chest tightness. She visited urgent care yesterday for this issue, and chest x-ray was done. No acute findings were appreciated at the visit yesterday, and she was prescribed an albuterol inhaler for her shortness of breath. She has not had a chance to pick up the inhaler yet. She typically does not have shortness  of breath.  ? ?Her other complaint today is of throat irritation and increased eructation after meals. She has noted foul-smelling breath when this happens. She also has noted increasing neck pain. When she has the neck pain, she also notices headaches. ? ?She has been referred to ENT for evaluation for an enlarged uvula. She has this appointment scheduled for April. She had a referral made for home sleep study in early December, but has not been contacted about this yet. She admits to continued issues with snoring and daytime sleepiness. ? ?10/23/21 ?The patient is seen in return follow-up and is yet to see otolaryngology or obtain her sleep study.  We will try to order the sleep study twice and otolaryngology would not see her I think in both instances this is due to her Friday health plan being a barrier.  On arrival blood pressure is elevated 142/85 but improved from prior values.  She is now on Aldactone 25 mg daily carvedilol 12 and half milligram twice daily amlodipine 10 mg daily and valsartan 320 mg daily.  She is trying to follow a healthier diet but she skips breakfast.  She has an existing appoint with advanced hypertension clinic upcoming.  She clearly has sleep apnea and we have yet to be able to achieve a sleep study.  She is still smoking she is down to 4 cigarettes a day which is an improvement.  She does not exercise regularly. ? ?Past Medical History:  ?Diagnosis Date  ? Hypertension   ? ? ?History reviewed. No pertinent surgical history. ? ?Family History  ?Problem  Relation Age of Onset  ? Hypertension Mother   ? Heart failure Mother   ? ? ?Social History  ? ?Socioeconomic History  ? Marital status: Single  ?  Spouse name: Not on file  ? Number of children: Not on file  ? Years of education: Not on file  ? Highest education level: Not on file  ?Occupational History  ? Not on file  ?Tobacco Use  ? Smoking status: Every Day  ?  Packs/day: 0.25  ?  Years: 9.00  ?  Pack years: 2.25  ?  Types:  Cigarettes  ? Smokeless tobacco: Never  ?Substance and Sexual Activity  ? Alcohol use: No  ? Drug use: Yes  ?  Types: Marijuana  ?  Comment: twice a week   ? Sexual activity: Yes  ?Other Topics Concern  ? Not on file  ?Social History Narrative  ? ** Merged History Encounter **  ?    ? ?Social Determinants of Health  ? ?Financial Resource Strain: Not on file  ?Food Insecurity: Not on file  ?Transportation Needs: Not on file  ?Physical Activity: Not on file  ?Stress: Not on file  ?Social Connections: Not on file  ?Intimate Partner Violence: Not on file  ? ? ?Outpatient Medications Prior to Visit  ?Medication Sig Dispense Refill  ? albuterol (VENTOLIN HFA) 108 (90 Base) MCG/ACT inhaler Inhale 1-2 puffs into the lungs every 6 (six) hours as needed for wheezing or shortness of breath. 1 each 0  ? amLODipine (NORVASC) 10 MG tablet TAKE 1 TABLET (10 MG TOTAL) BY MOUTH DAILY. 90 tablet 3  ? carvedilol (COREG) 12.5 MG tablet Take 1 tablet (12.5 mg total) by mouth 2 (two) times daily with a meal. 60 tablet 3  ? nicotine polacrilex (NICORETTE MINI) 4 MG lozenge Use three times a day to quit smoking 100 tablet 4  ? pantoprazole (PROTONIX) 40 MG tablet Take 1 tablet (40 mg total) by mouth daily. 30 tablet 3  ? spironolactone (ALDACTONE) 25 MG tablet Take 1 tablet (25 mg total) by mouth daily. 60 tablet 3  ? valsartan (DIOVAN) 320 MG tablet Take 1 tablet (320 mg total) by mouth daily. 90 tablet 3  ? ?No facility-administered medications prior to visit.  ? ? ?Allergies  ?Allergen Reactions  ? Ritalin [Methylphenidate Hcl] Rash  ? ? ?ROS ?Review of Systems  ?HENT: Negative.    ?Eyes: Negative.   ?Respiratory:  Positive for apnea (snoring) and shortness of breath. Negative for chest tightness.   ?Cardiovascular:  Negative for chest pain, palpitations and leg swelling.  ?Gastrointestinal:  Negative for constipation, diarrhea and vomiting. Abdominal distention: bloating. ?Genitourinary:  Negative for frequency.  ?Musculoskeletal:   Positive for neck pain.  ?Skin: Negative.   ?Neurological:  Negative for dizziness, weakness and headaches.  ?Psychiatric/Behavioral:  Positive for sleep disturbance.   ? ?  ?Objective:  ?  ?Physical Exam ?Vitals reviewed.  ?Constitutional:   ?   Appearance: Normal appearance. She is well-developed. She is obese. She is not diaphoretic.  ?HENT:  ?   Head: Normocephalic and atraumatic.  ?   Right Ear: External ear normal.  ?   Left Ear: External ear normal.  ?   Nose: No nasal deformity, septal deviation, mucosal edema or rhinorrhea.  ?   Right Sinus: No maxillary sinus tenderness or frontal sinus tenderness.  ?   Left Sinus: No maxillary sinus tenderness or frontal sinus tenderness.  ?   Mouth/Throat:  ?   Mouth: Mucous  membranes are moist.  ?   Pharynx: Oropharynx is clear. No oropharyngeal exudate or posterior oropharyngeal erythema. Uvula swelling: uvula notably elongated. ?Eyes:  ?   General: No scleral icterus. ?   Conjunctiva/sclera: Conjunctivae normal.  ?   Pupils: Pupils are equal, round, and reactive to light.  ?Neck:  ?   Thyroid: No thyromegaly.  ?   Vascular: No carotid bruit or JVD.  ?   Trachea: Trachea normal. No tracheal tenderness or tracheal deviation.  ?Cardiovascular:  ?   Rate and Rhythm: Normal rate and regular rhythm.  ?   Chest Wall: PMI is not displaced.  ?   Pulses: Normal pulses. No decreased pulses.  ?   Heart sounds: Normal heart sounds, S1 normal and S2 normal. Heart sounds not distant. No murmur heard. ?No systolic murmur is present.  ?No diastolic murmur is present.  ?  No friction rub. No gallop. No S3 or S4 sounds.  ?Pulmonary:  ?   Effort: Pulmonary effort is normal. No tachypnea, accessory muscle usage or respiratory distress.  ?   Breath sounds: Normal breath sounds. No stridor. No decreased breath sounds, wheezing, rhonchi or rales.  ?Chest:  ?   Chest wall: No tenderness.  ?Abdominal:  ?   General: Bowel sounds are normal. There is no distension.  ?   Palpations: Abdomen is  soft. Abdomen is not rigid.  ?   Tenderness: There is no abdominal tenderness. There is no guarding or rebound.  ?Musculoskeletal:     ?   General: Normal range of motion.  ?   Cervical back: Full passive range of

## 2021-10-23 NOTE — Assessment & Plan Note (Signed)
Improved with carvedilol ?

## 2021-10-23 NOTE — Assessment & Plan Note (Signed)
Continue antiacid therapy  ?

## 2021-10-23 NOTE — Assessment & Plan Note (Signed)
Blood pressure close to goal ?Continue current medications ?Focus on smoking cessation ?Attempted get sleep study performed ?Patient to keep advanced hypertension clinic appointment upcoming this week ?

## 2021-10-23 NOTE — Assessment & Plan Note (Signed)
Resolved with neck stretches ?

## 2021-10-26 ENCOUNTER — Encounter: Payer: Self-pay | Admitting: Radiology

## 2021-10-26 ENCOUNTER — Ambulatory Visit (INDEPENDENT_AMBULATORY_CARE_PROVIDER_SITE_OTHER): Payer: 59 | Admitting: Cardiovascular Disease

## 2021-10-26 ENCOUNTER — Encounter (HOSPITAL_BASED_OUTPATIENT_CLINIC_OR_DEPARTMENT_OTHER): Payer: Self-pay | Admitting: Cardiovascular Disease

## 2021-10-26 VITALS — BP 121/83 | HR 65 | Ht 63.0 in | Wt 202.0 lb

## 2021-10-26 DIAGNOSIS — R011 Cardiac murmur, unspecified: Secondary | ICD-10-CM

## 2021-10-26 DIAGNOSIS — R002 Palpitations: Secondary | ICD-10-CM | POA: Diagnosis not present

## 2021-10-26 DIAGNOSIS — G4733 Obstructive sleep apnea (adult) (pediatric): Secondary | ICD-10-CM

## 2021-10-26 DIAGNOSIS — F17200 Nicotine dependence, unspecified, uncomplicated: Secondary | ICD-10-CM | POA: Diagnosis not present

## 2021-10-26 DIAGNOSIS — Q249 Congenital malformation of heart, unspecified: Secondary | ICD-10-CM

## 2021-10-26 DIAGNOSIS — I10 Essential (primary) hypertension: Secondary | ICD-10-CM

## 2021-10-26 NOTE — Patient Instructions (Addendum)
Medication Instructions:  ?Your physician recommends that you continue on your current medications as directed. Please refer to the Current Medication list given to you today.  ? ?Labwork: ?RENIN/ALDOSTERONE LEVEL ON A Friday MORNING  ?HOLD YOUR SPIRONOLACTONE AND VALSARTAN Wednesday AND Thursday, TAKE AFTER YOU HAVE LABS ON Friday  ? ?Testing/Procedures: ?Your physician has recommended that you wear an event monitor. Event monitors are medical devices that record the heart?s electrical activity. Doctors most often [place] these monitors to diagnose arrhythmias. Arrhythmias are problems with the speed or rhythm of the heartbeat. The monitor is a small, portable device. You can wear one while you do your normal daily activities. This is usually used to diagnose what is causing palpitations/syncope (passing out). ?THIS WILL BE  MAILED TO YOU  ? ?Your physician has requested that you have an echocardiogram. Echocardiography is a painless test that uses sound waves to create images of your heart. It provides your doctor with information about the size and shape of your heart and how well your heart?s chambers and valves are working. This procedure takes approximately one hour. There are no restrictions for this procedure. ? ?Your physician has requested that you have a renal artery duplex. During this test, an ultrasound is used to evaluate blood flow to the kidneys. Allow one hour for this exam. Do not eat after midnight the day before and avoid carbonated beverages. Take your medications as you usually do. ? ?Follow-Up: ?2 MONTHS WITH CAITLIN W NP  ? ?1 YEAR WITH DR Monroe  You will receive a reminder letter in the mail two months in advance. If you don't receive a letter, please call our office to schedule the follow-up appointment. ? ? ?SPECIAL INSTRUCTIONS ? ?[employer] Cardiac Event Monitor Instructions ?Your physician has requested you wear your cardiac event monitor for __30___ days, (1-30). ?[employer] may call or  text to confirm a shipping address. The monitor will be sent to a land address via UPS. ?[employer] will not ship a monitor to a PO BOX. It typically takes 3-5 days to receive your monitor after it has ?been enrolled. [employer] will assist with USPS tracking if your package is delayed. The telephone number for ?[employer] is [phone]. ?Once you have received your monitor, please review the enclosed instructions. Instruction tutorials can also be ?viewed under help and settings on the enclosed cell phone. Your monitor has already been registered assigning ?a specific monitor serial # to you. ? ?Applying the monitor ?Remove cell phone from case and turn it on. The cell phone works as IT consultant and needs to be within ?10 feet of you at all times. The cell phone will need to be charged on a daily basis. We recommend you plug the ?cell phone into the enclosed charger at your bedside table every night. ? ?Monitor batteries: You will receive two monitor batteries labelled #1 and #2. These are your recorders. Plug ?battery #2 onto the second connection on the enclosed charger. Keep one battery on the charger at all times. ?This will keep the monitor battery deactivated. It will also keep it fully charged for when you need to switch ?your monitor batteries. A small light will be blinking on the battery emblem when it is charging. The light on the ?battery emblem will remain on when the battery is fully charged. ? ?Open package of a Monitor strip. Insert battery #1 into black hood on strip and gently squeeze monitor battery ?onto connection as indicated in instruction booklet. Set aside while preparing skin. ? ?  Choose location for your strip, vertical or horizontal, as indicated in the instruction booklet. Shave to remove all ?hair from location. There cannot be any lotions, oils, powders, or colognes on skin where monitor is to be ?applied. Wipe skin clean with enclosed Saline wipe. Dry skin completely. ? ?Peel  paper labeled #1 off the back of the Monitor strip exposing the adhesive. Place the monitor on the chest in ?the vertical or horizontal position shown in the instruction booklet. One arrow on the monitor strip must be ?pointing upward. Carefully remove paper labeled #2, attaching remainder of strip to your skin. Try not to ?create any folds or wrinkles in the strip as you apply it. ? ?Firmly press and release the circle in the center of the monitor battery. You will hear a small beep. This is ?turning the monitor battery on. The heart emblem on the monitor battery will light up every 5 seconds if the ?monitor battery in turned on and connected to the patient securely. Do not push and hold the circle down as ?this turns the monitor battery off. The cell phone will locate the monitor battery. A screen will appear on the ?cell phone checking the connection of your monitor strip. This may read poor connection initially but change to ?good connection within the next minute. Once your monitor accepts the connection you will hear a series of 3 ?beeps followed by a climbing crescendo of beeps. A screen will appear on the cell phone showing the two ?monitor strip placement options. Touch the picture that demonstrates where you applied the monitor strip. ? ?Your monitor strip and battery are waterproof. You are able to shower, bathe, or swim with the monitor on. ?They just ask you do not submerge deeper than 3 feet underwater. We recommend removing the monitor if ?you are swimming in a lake, river, or ocean. ? ?Your monitor battery will need to be switched to a fully charged monitor battery approximately once a week. ?The cell phone will alert you of an action which needs to be made. ? ?On the cell phone, tap for details to reveal connection status, monitor battery status, and cell phone battery ?status. The green dots indicates your monitor is in good status. A red dot indicates there is something that ?needs your  attention. ? ?To record a symptom, click the circle on the monitor battery. In 30-60 seconds a list of symptoms will appear on ?the cell phone. Select your symptom and tap save. Your monitor will record a sustained or significant ?arrhythmia regardless of you clicking the button. Some patients do not feel the heart rhythm irregularities. ?[employer] will notify us of any serious or critical events. ? ?Refer to instruction booklet for instructions on switching batteries, changing strips, the Do not disturb or Pause ?features, or any additional questions. ? ?Call [employer] at [phone], to confirm your monitor is transmitting and record your baseline. They will ?answer any questions you may have regarding the monitor instructions at that time. ? ?Returning the monitor to [employer] ?Place all equipment back into blue box. Peel off strip of paper to expose adhesive and close box securely. There ?is a prepaid UPS shipping label on this box. Drop in a UPS drop box, or at a UPS facility like [employer]. You may ?also contact [employer] to arrange UPS to pick up monitor package at your home. ?  ?

## 2021-10-26 NOTE — Progress Notes (Signed)
Enrolled patient for a 30 day [employer] event monitor to be mailed to patients home  

## 2021-10-26 NOTE — Progress Notes (Unsigned)
? ?Advanced Hypertension Clinic Initial Assessment:   ? ?Date:  10/26/2021  ? ?ID:  Sarah Small, DOB 06-19-1990, MRN [id number] ? ?PCP:  Storm FriskWright, Patrick E, MD  ?Cardiologist:  None  ?Nephrologist: ? ?Referring MD: Storm FriskWright, Patrick E, MD  ? ?CC: Hypertension ? ?History of Present Illness:   ? ?Sarah Small is a 31 y.o. female with a hx of hypertension and tobacco abuse here to establish care in the Advanced Hypertension Clinic. She was first seen on the mobile health van.  BP was 170/100s.  [facility] and they have done a great job of getting her blood pressure under control.  Prior to starting medication she had chest pain.  Since then she has not had any recurrent chest pain.  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  She is now on amlodipine, valsartan/HCTZ, and carvedilol.  She has noticed some intermittent palpitations.  It occurs less frequently now that her blood pressure is controlled, but it still happens at least once per month.  She has it when sometimes when she is laying down and other times while at work.  Sometimes it feels like her heart is stopping.  The episodes last for about 10 minutes at a time and her heart rate fluctuates during that time.  She doesn't get lightheaded or short of breath but she does get headaches.  She gets sharp pain on the R of her head  ? ?She has been doing good job of trying to make lifestyle changes.  Previously she smoked up to 1 pack of cigarettes daily.  She now smokes 3-4.  She has been gradually cutting back and using lozenges.  She is also trying to work on her diet.  She does occasionally have fast food but tries to cook at home.  She has been limiting her carbohydrate intake.  She works overnight and drinks 1 cup of coffee daily.  She notes that her mother had heart attacks in the past and had a heart transplant last year.  She reportedly  had congenital heart disease and recommended that all her children be tested.  Her wife notes that she does snore loudly and that she has apneic episodes.  She is working with Dr. Delford FieldWright to get a sleep study but this has been a challenge with her insurance. ? ?Previous antihypertensives: ?N/a ? ? ?Past Medical History:  ?Diagnosis Date  ? Hypertension   ? ? ?No past surgical history on file. ? ?Current Medications: ?Current Meds  ?Medication Sig  ? albuterol (VENTOLIN HFA) 108 (90 Base) MCG/ACT inhaler Inhale 1-2 puffs into the lungs every 6 (six) hours as needed for wheezing or shortness of breath.  ? amLODipine (NORVASC) 10 MG tablet TAKE 1 TABLET (10 MG TOTAL) BY MOUTH DAILY.  ? carvedilol (COREG) 12.5 MG tablet Take 1 tablet (12.5 mg total) by mouth 2 (two) times daily with a meal.  ? nicotine polacrilex (NICORETTE MINI) 4 MG lozenge Use three times a day to quit smoking  ? pantoprazole (PROTONIX) 40 MG tablet Take 1 tablet (40 mg total) by mouth daily.  ? spironolactone (ALDACTONE) 25 MG tablet Take 1 tablet (25 mg total) by mouth daily.  ? valsartan (DIOVAN) 320 MG tablet Take 1 tablet (320 mg total) by mouth daily.  ?  ? ?Allergies:   Ritalin [methylphenidate hcl]  ? ?Social History  ? ?  Socioeconomic History  ? Marital status: Single  ?  Spouse name: Not on file  ? Number of children: Not on file  ? Years of education: Not on file  ? Highest education level: Not on file  ?Occupational History  ? Not on file  ?Tobacco Use  ? Smoking status: Every Day  ?  Packs/day: 0.25  ?  Years: 9.00  ?  Pack years: 2.25  ?  Types: Cigarettes  ? Smokeless tobacco: Never  ?Substance and Sexual Activity  ? Alcohol use: No  ? Drug use: Yes  ?  Types: Marijuana  ?  Comment: twice a week   ? Sexual activity: Yes  ?Other Topics Concern  ? Not on file  ?Social History Narrative  ? ** Merged History Encounter **  ?    ? ?Social Determinants of Health  ? ?Financial Resource Strain: Low Risk   ? Difficulty of Paying Living Expenses:  Not hard at all  ?Food Insecurity: No Food Insecurity  ? Worried About Programme researcher, broadcasting/film/video in the Last Year: Never true  ? Ran Out of Food in the Last Year: Never true  ?Transportation Needs: No Transportation Needs  ? Lack of Transportation (Medical): No  ? Lack of Transportation (Non-Medical): No  ?Physical Activity: Inactive  ? Days of Exercise per Week: 0 days  ? Minutes of Exercise per Session: 0 min  ?Stress: Not on file  ?Social Connections: Not on file  ?  ? ?Family History: ?The patient's family history includes Heart failure in her mother; Hypertension in her mother. ? ?ROS:   ?Please see the history of present illness.    ?All other systems reviewed and are negative. ? ?EKGs/Labs/Other Studies Reviewed:   ? ?EKG:  EKG is sinus rhythm.  Rate 65 bpm.  Nonspecific T wave abnormalities.  Inferior T wave inversions.  Ordered today.  The ekg ordered today demonstrates  ? ?Recent Labs: ?No results found for requested labs within last 8760 hours.  ? ?Recent Lipid Panel ?No results found for: CHOL, TRIG, HDL, CHOLHDL, VLDL, LDLCALC, LDLDIRECT ? ?Physical Exam:   ?VS:  BP 121/83 (BP Location: Right Arm, Patient Position: Sitting, Cuff Size: Large)   Pulse 65   Ht 5\' 3"  (1.6 m)   Wt 202 lb (91.6 kg)   BMI 35.78 kg/m?  , BMI Body mass index is 35.78 kg/m?. ?GENERAL:  Well appearing ?HEENT: Pupils equal round and reactive, fundi not visualized, oral mucosa unremarkable ?NECK:  No jugular venous distention, waveform within normal limits, carotid upstroke brisk and symmetric, no bruits, no thyromegaly ?LUNGS:  Clear to auscultation bilaterally ?HEART:  RRR.  PMI not displaced or sustained,S1 and S2 within normal limits, no S3, no S4, no clicks, no rubs, no murmurs ?ABD:  Flat, positive bowel sounds normal in frequency in pitch, no bruits, no rebound, no guarding, no midline pulsatile mass, no hepatomegaly, no splenomegaly ?EXT:  2 plus pulses throughout, no edema, no cyanosis no clubbing ?SKIN:  No rashes no  nodules ?NEURO:  Cranial nerves II through XII grossly intact, motor grossly intact throughout ?PSYCH:  Cognitively intact, oriented to person place and time ? ? ?ASSESSMENT/PLAN:   ? ?No problem-specific Assessment & Plan notes found for this encounter. ? ? ?Screening for Secondary Hypertension: { ?Click here to document screening for secondary causes of HTN  : ?   ?Relevant Labs/Studies: ? ?  Latest Ref Rng & Units 01/17/2020  ?  4:37 PM 10/07/2016  ? 11:36 AM  ?  Basic Labs  ?Sodium 134 - 144 mmol/L 138   137    ?Potassium 3.5 - 5.2 mmol/L 3.8   3.9    ?Creatinine 0.57 - 1.00 mg/dL 7.20   9.47    ?  ? ?  Latest Ref Rng & Units 07/21/2020  ? 11:18 AM 01/17/2020  ?  4:37 PM  ?Thyroid   ?TSH 0.450 - 4.500 uIU/mL 1.130   3.050    ?  ?   ?   ?   ?   ? ? ? ?Disposition:    ?FU with MD/PharmD in {gen number [id number]} {Days to years:10300}  ? ? ?Medication Adjustments/Labs and Tests Ordered: ?Current medicines are reviewed at length with the patient today.  Concerns regarding medicines are outlined above.  ?No orders of the defined types were placed in this encounter. ? ?No orders of the defined types were placed in this encounter. ? ? ? ?Signed, ?Chilton Si, MD  ?10/26/2021 11:04 AM    ?[facility] ? ?

## 2021-11-02 ENCOUNTER — Ambulatory Visit (HOSPITAL_BASED_OUTPATIENT_CLINIC_OR_DEPARTMENT_OTHER): Payer: 59 | Admitting: Cardiovascular Disease

## 2021-11-08 ENCOUNTER — Ambulatory Visit (INDEPENDENT_AMBULATORY_CARE_PROVIDER_SITE_OTHER): Payer: 59

## 2021-11-08 DIAGNOSIS — R011 Cardiac murmur, unspecified: Secondary | ICD-10-CM

## 2021-11-08 DIAGNOSIS — I10 Essential (primary) hypertension: Secondary | ICD-10-CM

## 2021-11-08 DIAGNOSIS — R002 Palpitations: Secondary | ICD-10-CM

## 2021-11-09 LAB — ECHOCARDIOGRAM COMPLETE
AR max vel: 1.65 cm2
AV Area VTI: 1.62 cm2
AV Area mean vel: 1.66 cm2
AV Mean grad: 5 mmHg
AV Peak grad: 9.2 mmHg
Ao pk vel: 1.52 m/s
Area-P 1/2: 3.36 cm2
Calc EF: 57.7 %
S' Lateral: 3.59 cm
Single Plane A2C EF: 59.5 %
Single Plane A4C EF: 55.6 %

## 2021-11-13 ENCOUNTER — Ambulatory Visit (INDEPENDENT_AMBULATORY_CARE_PROVIDER_SITE_OTHER): Payer: 59

## 2021-11-13 DIAGNOSIS — R002 Palpitations: Secondary | ICD-10-CM

## 2021-11-18 LAB — ALDOSTERONE + RENIN ACTIVITY W/ RATIO
ALDOS/RENIN RATIO: 17.1 (ref 0.0–30.0)
ALDOSTERONE: 4.9 ng/dL (ref 0.0–30.0)
Renin: 0.286 ng/mL/hr (ref 0.167–5.380)

## 2021-12-08 ENCOUNTER — Encounter (HOSPITAL_BASED_OUTPATIENT_CLINIC_OR_DEPARTMENT_OTHER): Payer: Self-pay | Admitting: Cardiovascular Disease

## 2021-12-08 DIAGNOSIS — Q249 Congenital malformation of heart, unspecified: Secondary | ICD-10-CM

## 2021-12-08 HISTORY — DX: Congenital malformation of heart, unspecified: Q24.9

## 2021-12-08 NOTE — Assessment & Plan Note (Addendum)
Blood pressure was nearly at goal today.  Given her age we will work-up secondary causes.  She is already in the process of getting a sleep study.  We will have her hold valsartan and spironolactone for 2 days and then come back to test renal and and aldosterone levels.  She will then resume her medications.  We will also check renal artery Dopplers.  Thyroid function has been normal.  Continue amlodipine, carvedilol, spironolactone, and valsartan.  Continue working on smoking cessation.  We discussed limiting sodium intake and exercising at least 150 minutes weekly.

## 2021-12-27 ENCOUNTER — Encounter (HOSPITAL_BASED_OUTPATIENT_CLINIC_OR_DEPARTMENT_OTHER): Payer: Self-pay | Admitting: Family

## 2021-12-27 ENCOUNTER — Ambulatory Visit (HOSPITAL_BASED_OUTPATIENT_CLINIC_OR_DEPARTMENT_OTHER): Payer: 59 | Admitting: Family

## 2021-12-27 VITALS — BP 134/82 | HR 64 | Ht 63.0 in | Wt 211.0 lb

## 2021-12-27 DIAGNOSIS — Z720 Tobacco use: Secondary | ICD-10-CM | POA: Diagnosis not present

## 2021-12-27 DIAGNOSIS — I10 Essential (primary) hypertension: Secondary | ICD-10-CM | POA: Diagnosis not present

## 2021-12-27 MED ORDER — CARVEDILOL 25 MG PO TABS
25.0000 mg | ORAL_TABLET | Freq: Two times a day (BID) | ORAL | 5 refills | Status: DC
Start: 2021-12-27 — End: 2022-06-25

## 2021-12-27 MED ORDER — NICOTINE 7 MG/24HR TD PT24: 7.0000 mg | MEDICATED_PATCH | Freq: Every day | TRANSDERMAL | 0 refills | Status: DC

## 2021-12-27 MED ORDER — NICOTINE 14 MG/24HR TD PT24: 14.0000 mg | MEDICATED_PATCH | Freq: Every day | TRANSDERMAL | 0 refills | Status: DC

## 2021-12-27 NOTE — Patient Instructions (Addendum)
Medication Instructions:  Your physician has recommended you make the following change in your medication:   INCREASE Carvedilol to 25mg  twice daily  APPLY NICOTINE 14 MG PATCH DAILY FOR 4 WEEKS AND THEN DECREASE TO 7 MG DAILY  DO NOT SMOKE WHILE WEARING PATCH   *If you need a refill on your cardiac medications before your next appointment, please call your pharmacy*  Lab Work: None ordered today.   Testing/Procedures: None ordered today.   Follow-Up: At [facility], you and your health needs are our priority.  As part of our continuing mission to provide you with exceptional heart care, we have created designated Provider Care Teams.  These Care Teams include your primary Cardiologist (physician) and Advanced Practice Providers (APPs -  Physician Assistants and Nurse Practitioners) who all work together to provide you with the care you need, when you need it.  We recommend signing up for the patient portal called "MyChart".  Sign up information is provided on this After Visit Summary.  MyChart is used to connect with patients for Virtual Visits (Telemedicine).  Patients are able to view lab/test results, encounter notes, upcoming appointments, etc.  Non-urgent messages can be sent to your provider as well.   To learn more about what you can do with MyChart, go to [facility].    Your next appointment:   3 month(s)  The format for your next appointment:   In Person  Provider:   [email or web address], MD or Chilton Si, NP (Hypertension Clinic)    Other Instructions  Managing the Challenge of Quitting Smoking Quitting smoking is a physical and mental challenge. You may have cravings, withdrawal symptoms, and temptation to smoke. Before quitting, work with your health care provider to make a plan that can help you manage quitting. Making a plan before you quit may keep you from smoking when you have the urge to smoke while trying to quit. How to manage lifestyle  changes Managing stress Stress can make you want to smoke, and wanting to smoke may cause stress. It is important to find ways to manage your stress. You could try some of the following: Practice relaxation techniques. Breathe slowly and deeply, in through your nose and out through your mouth. Listen to music. Soak in a bath or take a shower. Imagine a peaceful place or vacation. Get some support. Talk with family or friends about your stress. Join a support group. Talk with a counselor or therapist. Get some physical activity. Go for a walk, run, or bike ride. Play a favorite sport. Practice yoga.  Medicines Talk with your health care provider about medicines that might help you deal with cravings and make quitting easier for you. Relationships Social situations can be difficult when you are quitting smoking. To manage this, you can: Avoid parties and other social situations where people might be smoking. Avoid alcohol. Leave right away if you have the urge to smoke. Explain to your family and friends that you are quitting smoking. Ask for support and let them know you might be a bit grumpy. Plan activities where smoking is not an option. General instructions Be aware that many people gain weight after they quit smoking. However, not everyone does. To keep from gaining weight, have a plan in place before you quit, and stick to the plan after you quit. Your plan should include: Eating healthy snacks. When you have a craving, it may help to: Eat popcorn, or try carrots, celery, or other cut vegetables. Chew sugar-free gum. Changing  how you eat. Eat small portion sizes at meals. Eat 4-6 small meals throughout the day instead of 1-2 large meals a day. Be mindful when you eat. You should avoid watching television or doing other things that might distract you as you eat. Exercising regularly. Make time to exercise each day. If you do not have time for a long workout, do short bouts of  exercise for 5-10 minutes several times a day. Do some form of strengthening exercise, such as weight lifting. Do some exercise that gets your heart beating and causes you to breathe deeply, such as walking fast, running, swimming, or biking. This is very important. Drinking plenty of water or other low-calorie or no-calorie drinks. Drink enough fluid to keep your urine pale yellow.  How to recognize withdrawal symptoms Your body and mind may experience discomfort as you try to get used to not having nicotine in your system. These effects are called withdrawal symptoms. They may include: Feeling hungrier than normal. Having trouble concentrating. Feeling irritable or restless. Having trouble sleeping. Feeling depressed. Craving a cigarette. These symptoms may surprise you, but they are normal to have when quitting smoking. To manage withdrawal symptoms: Avoid places, people, and activities that trigger your cravings. Remember why you want to quit. Get plenty of sleep. Avoid coffee and other drinks that contain caffeine. These may worsen some of your symptoms. How to manage cravings Come up with a plan for how to deal with your cravings. The plan should include the following: A definition of the specific situation you want to deal with. An activity or action you will take to replace smoking. A clear idea for how this action will help. The name of someone who could help you with this. Cravings usually last for 5-10 minutes. Consider taking the following actions to help you with your plan to deal with cravings: Keep your mouth busy. Chew sugar-free gum. Suck on hard candies or a straw. Brush your teeth. Keep your hands and body busy. Change to a different activity right away. Squeeze or play with a ball. Do an activity or a hobby, such as making bead jewelry, practicing needlepoint, or working with wood. Mix up your normal routine. Take a short exercise break. Go for a quick walk, or  run up and down stairs. Focus on doing something kind or helpful for someone else. Call a friend or family member to talk during a craving. Join a support group. Contact a quitline. Where to find support To get help or find a support group: Call the [employer] [facility]'s Smoking Quitline: 1-800-QUIT-NOW (907)428-3491) Text QUIT to SmokefreeTXTAZ:4618977 Where to find more information Visit these websites to find more information on quitting smoking: U.S. Department of Health and Human Services: www.smokefree.gov American Lung Association: www.freedomfromsmoking.org Centers for Disease Control and Prevention (CDC): http://www.wolf.info/ American Heart Association: www.heart.org Contact a health care provider if: You want to change your plan for quitting. The medicines you are taking are not helping. Your eating feels out of control or you cannot sleep. You feel depressed or become very anxious. Summary Quitting smoking is a physical and mental challenge. You will face cravings, withdrawal symptoms, and temptation to smoke again. Preparation can help you as you go through these challenges. Try different techniques to manage stress, handle social situations, and prevent weight gain. You can deal with cravings by keeping your mouth busy (such as by chewing gum), keeping your hands and body busy, calling family or friends, or contacting a quitline for people  who want to quit smoking. You can deal with withdrawal symptoms by avoiding places where people smoke, getting plenty of rest, and avoiding drinks that contain caffeine. This information is not intended to replace advice given to you by your health care provider. Make sure you discuss any questions you have with your health care provider. Document Revised: 05/25/2021 Document Reviewed: 05/25/2021 Elsevier Patient Education  2023 [employer].

## 2021-12-27 NOTE — Progress Notes (Signed)
Advanced Hypertension Clinic Initial Assessment:    Date:  12/28/2021   ID:  Sarah Small, DOB 1990-08-25, MRN [id number]  PCP:  Elsie Stain, MD  Cardiologist:  None  Nephrologist:  Referring MD: Elsie Stain, MD   CC: Hypertension  History of Present Illness:    Sarah Small is a 31 y.o. female with a hx of hypertension, GERD, tobacco use here to follow up in the Advanced Hypertension Clinic.   Last seen 10/26/21. Recommended for event monitor, echo, renal duplex. Renal duplex 10/2021 no stenosis. Echo normal LVEF and no significant valvular abnormalities. Monitor with predominantly NSr with rare PVC <1% burden  Pleasant lady who lives with her wife and 59 year old Psychiatrist. Notes she has sleep study upcoming. PCP was able to find one that worked well with her insurance. She has cut out coffee and been drinking smoothies in the morning instead. She is checkignb lood pressure at home. Tells me it has been up and down ranging 110s-140s. 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 Takes her medication around 6:30 AM and then at 3:30 PM. She is smoking a half pack per day. Motivated to quit.   Reports no shortness of breath nor dyspnea on exertion. Reports no chest pain, pressure, or tightness. No edema, orthopnea, PND. Reports no palpitations.    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  Current Medications: Current Meds  Medication Sig   albuterol (VENTOLIN HFA) 108 (90 Base) MCG/ACT inhaler Inhale 1-2 puffs into the lungs every 6 (six) hours as needed for wheezing or shortness of breath.   amLODipine (NORVASC) 10 MG tablet TAKE 1 TABLET (10 MG TOTAL) BY MOUTH DAILY.   carvedilol (COREG) 25 MG tablet Take 1 tablet (25 mg total) by mouth 2 (two) times daily.   nicotine (NICODERM CQ - DOSED IN MG/24 HOURS) 14 mg/24hr  patch Place 1 patch (14 mg total) onto the skin daily.   nicotine (NICODERM CQ - DOSED IN MG/24 HR) 7 mg/24hr patch Place 1 patch (7 mg total) onto the skin daily.   nicotine polacrilex (NICORETTE MINI) 4 MG lozenge Use three times a day to quit smoking   pantoprazole (PROTONIX) 40 MG tablet Take 1 tablet (40 mg total) by mouth daily.   spironolactone (ALDACTONE) 25 MG tablet Take 1 tablet (25 mg total) by mouth daily.   valsartan (DIOVAN) 320 MG tablet Take 1 tablet (320 mg total) by mouth daily.   [DISCONTINUED] carvedilol (COREG) 12.5 MG tablet Take 1 tablet (12.5 mg total) by mouth 2 (two) times daily with a meal.     Allergies:   Ritalin [methylphenidate hcl]   Social History   Socioeconomic History   Marital status: Single    Spouse name: Not on file   Number of children: Not on file   Years of education: Not on file   Highest education level: Not on file  Occupational History   Not on file  Tobacco Use   Smoking status: Every Day    Packs/day: 0.25    Years: 9.00    Total pack years: 2.25    Types: Cigarettes   Smokeless tobacco: Never  Substance and Sexual Activity   Alcohol use: No   Drug use: Yes    Types: Marijuana    Comment: twice a week  Sexual activity: Yes  Other Topics Concern   Not on file  Social History Narrative   ** Merged History Encounter **       Social Determinants of Health   Financial Resource Strain: Low Risk  (10/26/2021)   Overall Financial Resource Strain (CARDIA)    Difficulty of Paying Living Expenses: Not hard at all  Food Insecurity: No Food Insecurity (10/26/2021)   Hunger Vital Sign    Worried About Running Out of Food in the Last Year: Never true    Ran Out of Food in the Last Year: Never true  Transportation Needs: No Transportation Needs (10/26/2021)   PRAPARE - Hydrologist (Medical): No    Lack of Transportation (Non-Medical): No  Physical Activity: Inactive (10/26/2021)   Exercise Vital Sign     Days of Exercise per Week: 0 days    Minutes of Exercise per Session: 0 min  Stress: Not on file  Social Connections: Not on file     Family History: The patient's family history includes Heart attack in her mother; Heart failure in her mother; Hypertension in her mother.  ROS:   Please see the history of present illness.     All other systems reviewed and are negative.  EKGs/Labs/Other Studies Reviewed:    EKG:  No EKG today  Renal duplex 10/2021  Summary:  Largest Aortic Diameter: 1.6 cm    Renal:    Right: Normal size right kidney. Normal right Resisitive Index.         Normal cortical thickness of right kidney. No evidence of         right renal artery stenosis. RRV flow present.  Left:  Normal size of left kidney. Normal left Resistive Index.         Normal cortical thickness of the left kidney. No evidence of         left renal artery stenosis. LRV flow present.  Mesenteric:  Normal Celiac artery and Superior Mesenteric artery findings.    ZIO 10/2021 Quality: Fair.  Baseline artifact. Predominant rhythm: sinus rhythm Average heart rate: 78 bpm Max heart rate: 131 bpm Min heart rate: 52 bpm Pauses >2.5 seconds: none Occasional PVCs    Echo 10/2021  1. Left ventricular ejection fraction, by estimation, is 55 to 60%. The  left ventricle has normal function. The left ventricle has no regional  wall motion abnormalities. Left ventricular diastolic parameters were  normal. The average left ventricular  global longitudinal strain is -15.8 %. The global longitudinal strain is  abnormal.   2. Right ventricular systolic function is normal. The right ventricular  size is normal.   3. Left atrial size was mildly dilated.   4. The mitral valve is normal in structure. Trivial mitral valve  regurgitation. No evidence of mitral stenosis.   5. The aortic valve is tricuspid. Aortic valve regurgitation is not  visualized. No aortic stenosis is present.   6. The inferior  vena cava is normal in size with greater than 50%  respiratory variability, suggesting right atrial pressure of 3 mmHg.  Recent Labs: No results found for requested labs within last 365 days.   Recent Lipid Panel No results found for: "CHOL", "TRIG", "HDL", "CHOLHDL", "VLDL", "LDLCALC", "LDLDIRECT"  Physical Exam:   VS:  BP 134/82 (BP Location: Right Arm, Patient Position: Sitting)   Pulse 64   Ht 5' 3"  (1.6 m)   Wt 211 lb (95.7 kg)   BMI 37.38 kg/m  ,  BMI Body mass index is 37.38 kg/m. GENERAL:  Well appearing HEENT: Pupils equal round and reactive, fundi not visualized, oral mucosa unremarkable NECK:  No jugular venous distention, waveform within normal limits, carotid upstroke brisk and symmetric, no bruits, no thyromegaly LYMPHATICS:  No cervical adenopathy LUNGS:  Clear to auscultation bilaterally HEART:  RRR.  PMI not displaced or sustained,S1 and S2 within normal limits, no S3, no S4, no clicks, no rubs,  murmurs ABD:  Flat, positive bowel sounds normal in frequency in pitch, no bruits, no rebound, no guarding, no midline pulsatile mass, no hepatomegaly, no splenomegaly EXT:  2 plus pulses throughout, no edema, no cyanosis no clubbing SKIN:  No rashes no nodules NEURO:  Cranial nerves II through XII grossly intact, motor grossly intact throughout PSYCH:  Cognitively intact, oriented to person place and time  ASSESSMENT/PLAN:     HTN - BP still above goal <130/80. Increase Coreg to 53m BID. Discussed to monitor BP at home at least 2 hours after medications and sitting for 5-10 minutes. Continue Amlodipine 151mQD, Spironolactone 2543mD. Heart healthy diet and regular cardiovascular exercise encouraged.   PVC - occasional by monitor. Encouraged to increase hydration, avoid etoh, manage stress well. Increase Coreg, as above.   Snores - Possible etiology of HTN. Sleep study upcoming with PCP.   Tobacco use - Motivated to quit. Rx nicotine patch 28m71m x 30 days then 7mg 69m x 30 days. Discussed possible utilization of Wellbutrin and she will reach out if difficulty quitting with patch.   Screening for Secondary Hypertension:     10/26/2021   11:14 AM  Causes  Drugs/Herbals Screened     - Comments one coffee daily.  no EtOh,  igh sodium    Relevant Labs/Studies:    Latest Ref Rng & Units 01/17/2020    4:37 PM 10/07/2016   11:36 AM  Basic Labs  Sodium 134 - 144 mmol/L 138  137   Potassium 3.5 - 5.2 mmol/L 3.8  3.9   Creatinine 0.57 - 1.00 mg/dL 0.82  0.80        Latest Ref Rng & Units 07/21/2020   11:18 AM 01/17/2020    4:37 PM  Thyroid   TSH 0.450 - 4.500 uIU/mL 1.130  3.050        Latest Ref Rng & Units 11/08/2021    9:06 AM  Renin/Aldosterone   Aldosterone 0.0 - 30.0 ng/dL 4.9   Renin 0.167 - 5.380 ng/mL/hr 0.286   Aldos/Renin Ratio 0.0 - 30.0 17.1              11/08/2021    9:22 AM  Renovascular   Renal Artery US CoKorealeted Yes           Disposition:    FU with MD/PharmD in 3 months    Medication Adjustments/Labs and Tests Ordered: Current medicines are reviewed at length with the patient today.  Concerns regarding medicines are outlined above.  No orders of the defined types were placed in this encounter.  Meds ordered this encounter  Medications   nicotine (NICODERM CQ - DOSED IN MG/24 HOURS) 14 mg/24hr patch    Sig: Place 1 patch (14 mg total) onto the skin daily.    Dispense:  28 patch    Refill:  0    Order Specific Question:   Supervising Provider    Answer:   CHRISMaris Bergercotine (NICODERM CQ - DOSED IN MG/24 HR) 7 mg/24hr patch  Sig: Place 1 patch (7 mg total) onto the skin daily.    Dispense:  28 patch    Refill:  0    Order Specific Question:   Supervising Provider    Answer:   Buford Dresser [[id number]]   carvedilol (COREG) 25 MG tablet    Sig: Take 1 tablet (25 mg total) by mouth 2 (two) times daily.    Dispense:  60 tablet    Refill:  5    Order Specific Question:    Supervising Provider    Answer:   Buford Dresser [[id number]]     Signed, Loel Dubonnet, NP  12/28/2021 10:18 PM    [place]

## 2021-12-28 ENCOUNTER — Encounter (HOSPITAL_BASED_OUTPATIENT_CLINIC_OR_DEPARTMENT_OTHER): Payer: Self-pay | Admitting: Family

## 2022-01-23 ENCOUNTER — Ambulatory Visit: Payer: 59 | Attending: Critical Care Medicine | Admitting: Critical Care Medicine

## 2022-01-23 ENCOUNTER — Encounter: Payer: Self-pay | Admitting: Critical Care Medicine

## 2022-01-23 VITALS — BP 148/89 | HR 64 | Temp 98.5°F | Resp 18 | Ht 64.0 in | Wt 211.0 lb

## 2022-01-23 DIAGNOSIS — I10 Essential (primary) hypertension: Secondary | ICD-10-CM | POA: Diagnosis not present

## 2022-01-23 DIAGNOSIS — G4733 Obstructive sleep apnea (adult) (pediatric): Secondary | ICD-10-CM

## 2022-01-23 DIAGNOSIS — R002 Palpitations: Secondary | ICD-10-CM | POA: Diagnosis not present

## 2022-01-23 DIAGNOSIS — B353 Tinea pedis: Secondary | ICD-10-CM

## 2022-01-23 DIAGNOSIS — F17200 Nicotine dependence, unspecified, uncomplicated: Secondary | ICD-10-CM

## 2022-01-23 DIAGNOSIS — L209 Atopic dermatitis, unspecified: Secondary | ICD-10-CM

## 2022-01-23 MED ORDER — NICOTINE 21 MG/24HR TD PT24: 21.0000 mg | MEDICATED_PATCH | Freq: Every day | TRANSDERMAL | 0 refills | Status: DC

## 2022-01-23 MED ORDER — SPIRONOLACTONE 25 MG PO TABS: 25.0000 mg | ORAL_TABLET | Freq: Every day | ORAL | 3 refills | Status: DC

## 2022-01-23 MED ORDER — VALSARTAN 320 MG PO TABS: 320.0000 mg | ORAL_TABLET | Freq: Every day | ORAL | 3 refills | Status: DC

## 2022-01-23 MED ORDER — CLOBETASOL PROPIONATE 0.05 % EX CREA: 1.0000 | TOPICAL_CREAM | Freq: Two times a day (BID) | CUTANEOUS | 0 refills | Status: DC

## 2022-01-23 MED ORDER — CLOTRIMAZOLE-BETAMETHASONE 1-0.05 % EX CREA: 1.0000 | TOPICAL_CREAM | Freq: Every day | CUTANEOUS | 0 refills | Status: DC

## 2022-01-23 MED ORDER — AMLODIPINE BESYLATE 10 MG PO TABS: ORAL_TABLET | Freq: Every day | ORAL | 3 refills | Status: DC

## 2022-01-23 MED ORDER — NICOTINE POLACRILEX 4 MG MT LOZG: LOZENGE | OROMUCOSAL | 4 refills | Status: DC

## 2022-01-23 NOTE — Assessment & Plan Note (Addendum)
Unfortunately, due to her insurance situation, can not order a repeat sleep study at this time Patient is in the process of obtaining [employer], she was instructed to call us when this has been obtained. At that point we will reorder a sleep study through our preferred company.   Per ENT she is not a surgical candidate.

## 2022-01-23 NOTE — Patient Instructions (Addendum)
Increase daily carvedilol dose to 25 mg twice a day, begin this immediately   Continue to track your blood pressure at home   Nicotine patches and Nicotene lozanges have been sent to pharmacy   Keep upcoming appointment with Cardiology on 04/12/2022   Return Dr Delford Field 3 weeks blood pressure check   Start lotrisone twice a day for foot fungus  Start Temovate twice a day for hand eczema

## 2022-01-23 NOTE — Progress Notes (Signed)
F/u sleep study HTN Little rash on hands. It is itching

## 2022-01-23 NOTE — Assessment & Plan Note (Signed)
Clobetasol cream sent to pharmacy 

## 2022-01-23 NOTE — Assessment & Plan Note (Signed)
Physical exam findings suggestive of athletes foot.  Lotrisone cream sent to pharmacy

## 2022-01-23 NOTE — Assessment & Plan Note (Signed)
Patient instructed to increase her daily carvedilol dose to 25 mg twice a day, cardiology recommended this regimen on 01/02/2022 Continue all other medications as before Refills sent to pharmacy  Patient should continue at home blood pressure monitoring Follow up in 3 weeks for close management She has cardiology appointment 04/12/2022

## 2022-01-23 NOTE — Assessment & Plan Note (Signed)
Negative echocardiogram  20 day cardiac event study- normal with a few PVC's- no dangerous arrhythmia  Reviewed these results with patient today She believes anxiety may exacerbate her symptoms, expressed relief after explanation of her cardiac studies We will continue to observe Recommend she keep her follow up with cardiology on 04/12/2022 and discuss with them further if symptoms persist

## 2022-01-23 NOTE — Progress Notes (Addendum)
Established Patient Office Visit  Subjective   Patient ID: Sarah Small, female    DOB: January 25, 1991  Age: 31 y.o. MRN: [id number]  Chief Complaint  Patient presents with   Hypertension   Rash    Sarah Small is a 31 year old female with history of hypertension, obstructive sleep apnea and tobacco use. She presents today with her wife for follow up.   She was referred to cardiology for hypertension and intermittent palpations. They performed an echocardiogram and a cardiac monitor trial- results below. They also suggested she increase her daily carvedilol to 25 mg twice a day. She has not done this yet and is still taking 12.5 mg BID. Her blood pressure today is 146/95. She does take her blood pressure at home and reports her numbers run around this as well. Other blood pressure medications include: amlodipine 20 mg daily, aldactone 25 mg daily and valsartan 320 mg daily. She does still feel intermittent palpitations, most recently last night, during activity with her wife. She reports that her wife calming her down and instructing her to breathe alleviated the symptoms.   She was evaluatated by ENT for witnessed apneas, daytime fatigue and elongated uvula. They ordered a sleep study through the company [employer]. Unfortunately, patient did not sleep long enough for adequate data and will need to repeat this. However, she can not financially afford the fee at this time. Her wife reports they are obtaining [place] insurance soon and is wondering if there is another option to order the study.   She is still smoking cigarettes and is interested in the nicotine patches. Reports cardiology prescribed these for her but she has not started because it is not the correct level for the amount she smokes. Notes a recent increase in use due to increase in stress with work.  She has been experiencing a hand and foot rash with itching. Rash occurs on the dorsal side of hands and the plantar surface of her feet.  Denies any new cleaning or beauty products.      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 No pertinent surgical history. Social History   Tobacco Use   Smoking status: Every Day    Packs/day: 0.25    Years: 9.00    Total pack years: 2.25    Types: Cigarettes   Smokeless tobacco: Never  Substance Use Topics   Alcohol use: No   Drug use: Yes    Types: Marijuana    Comment: twice a week    Family History  Problem Relation Age of Onset   Heart attack Mother    Hypertension Mother    Heart failure Mother        transplant   Allergies  Allergen Reactions   Ritalin [Methylphenidate Hcl] Rash      Review of Systems  Constitutional:  Negative for chills and fever.  HENT:  Negative for congestion, hearing loss  and sore throat.   Eyes: Negative.   Respiratory:  Negative for cough and sputum production.   Cardiovascular:  Negative for chest pain and leg swelling.  Gastrointestinal: Negative.   Genitourinary: Negative.   Musculoskeletal: Negative.   Skin:  Positive for itching and rash.  Neurological: Negative.   Endo/Heme/Allergies: Negative.   Psychiatric/Behavioral: Negative.        Objective:     BP (!) 148/89 (BP Location: Left Arm, Patient Position: Sitting, Cuff Size: Normal)   Pulse 64   Temp 98.5 F (36.9 C) (Oral)   Resp 18   Ht 5\' 4"  (1.626 m)    Wt 211 lb (95.7 kg)   LMP 12/25/2021 (Exact Date)   SpO2 100%   BMI 36.22 kg/m     Physical Exam Constitutional:      Appearance: She is obese.  HENT:     Right Ear: Tympanic membrane, ear canal and external ear normal.     Left Ear: Tympanic membrane, ear canal and external ear normal.     Mouth/Throat:     Mouth: Mucous membranes are moist.     Pharynx: Oropharynx is clear.  Eyes:     Conjunctiva/sclera: Conjunctivae normal.  Cardiovascular:     Rate and Rhythm: Normal rate and regular rhythm.     Pulses: Normal pulses.     Heart sounds: Normal heart sounds.  Pulmonary:     Effort: Pulmonary effort is normal.     Breath sounds: Normal breath sounds.  Abdominal:     General: Bowel sounds are normal.     Palpations: Abdomen is soft.  Skin:    General: Skin is warm.     Capillary Refill: Capillary refill takes less than 2 seconds.     Findings: Rash present.     Comments: Multiple scattered erythematous papules bilateral dorsal surface of hand Multiple small areas of hyperpigmentation bottom of bilateral feet  Fungal excoriations present both feet   Neurological:     Mental Status: She is alert and oriented to person, place, and time.  Psychiatric:        Mood and Affect: Mood normal.        Behavior: Behavior normal.   RECENT CV STUDIES Echocardiogram 11/08/2021   1. Left ventricular ejection fraction, by estimation, is 55 to 60%. The  left ventricle has normal function. The left ventricle has no regional  wall motion abnormalities. Left ventricular diastolic parameters were  normal. The average left ventricular  global longitudinal strain is -15.8 %. The global longitudinal strain is  abnormal.   2. Right ventricular systolic function is normal. The right ventricular  size is normal.   3. Left atrial size was mildly dilated.   4. The mitral valve is normal in structure. Trivial mitral valve  regurgitation. No evidence of mitral stenosis.   5. The aortic valve  is tricuspid. Aortic valve regurgitation is not  visualized. No aortic stenosis is present.   6. The inferior vena cava is normal in size with greater than 50%  respiratory variability, suggesting right atrial pressure of 3 mmHg.     20 Day Event Monitor 12/07/2021   Quality: Fair.  Baseline artifact. Predominant rhythm: sinus rhythm Average heart rate: 78 bpm Max heart rate: 131 bpm Min heart rate: 52 bpm Pauses >2.5 seconds: none Occasional PVCs   No results found for any visits on 01/23/22.     The ASCVD Risk score (Arnett DK, et al., 2019) failed to calculate for the following  reasons:   The 2019 ASCVD risk score is only valid for ages 49 to 30    Assessment & Plan:   Problem List Items Addressed This Visit       Cardiovascular and Mediastinum   Essential hypertension - Primary    Patient instructed to increase her daily carvedilol dose to 25 mg twice a day, cardiology recommended this regimen on 01/02/2022 Continue all other medications as before Refills sent to pharmacy  Patient should continue at home blood pressure monitoring Follow up in 3 weeks for close management She has cardiology appointment 04/12/2022       Relevant Medications   amLODipine (NORVASC) 10 MG tablet   spironolactone (ALDACTONE) 25 MG tablet   valsartan (DIOVAN) 320 MG tablet     Respiratory   OSA (obstructive sleep apnea)    Unfortunately, due to her insurance situation, can not order a repeat sleep study at this time Patient is in the process of obtaining [employer], she was instructed to call us when this has been obtained. At that point we will reorder a sleep study through our preferred company.   Per ENT she is not a surgical candidate.         Musculoskeletal and Integument   Tinea pedis    Physical exam findings suggestive of athletes foot.  Lotrisone cream sent to pharmacy       Relevant Medications   clotrimazole-betamethasone (LOTRISONE) cream   Atopic  dermatitis of both hands    Clobetasol cream sent to pharmacy         Other   Tobacco dependency       Current smoking consumption amount: 1PPD Dicsussion on advise to quit smoking and smoking impacts: Cardiovascular impacts  Patient's willingness to quit: Wants to quit  Methods to quit smoking discussed: Nicotine replacement  Medication management of smoking session drugs discussed: Nicotine replacement Nicotine patch 21 mg/24 hr sent to pharmacy today, advised patient to use these instead of the 14mg /24hr ones previously sent by cardiology  Nicotine lozenges sent to pharmacy as well   Resources provided:  AVS   Setting quit date not established  Follow-up arranged 3 weeks  Time spent counseling the patient: 5 minutes       Relevant Medications   nicotine polacrilex (NICORETTE MINI) 4 MG lozenge   nicotine (NICODERM CQ - DOSED IN MG/24 HOURS) 21 mg/24hr patch   Palpitations    Negative echocardiogram  20 day cardiac event study- normal with a few PVC's- no dangerous arrhythmia  Reviewed these results with patient today She believes anxiety may exacerbate her symptoms, expressed relief after explanation of her cardiac studies We will continue to observe Recommend she keep her follow up with cardiology on 04/12/2022 and discuss with them further if symptoms persist      38 min spent on history /physical and patient education  Return in about 3 weeks (around 02/13/2022) for htn.    02/15/2022, MD

## 2022-01-23 NOTE — Assessment & Plan Note (Addendum)
    Current smoking consumption amount: 1PPD . Dicsussion on advise to quit smoking and smoking impacts: Cardiovascular impacts  . Patient's willingness to quit: Wants to quit  . Methods to quit smoking discussed: Nicotine replacement  . Medication management of smoking session drugs discussed: Nicotine replacement Nicotine patch 21 mg/24 hr sent to pharmacy today, advised patient to use these instead of the 14mg /24hr ones previously sent by cardiology  Nicotine lozenges sent to pharmacy as well .   Resources provided:  AVS   . Setting quit date not established  Follow-up arranged 3 weeks  Time spent counseling the patient: 5 minutes

## 2022-02-21 ENCOUNTER — Encounter: Payer: Self-pay | Admitting: Critical Care Medicine

## 2022-02-21 ENCOUNTER — Ambulatory Visit: Payer: Commercial Managed Care - HMO | Attending: Critical Care Medicine | Admitting: Critical Care Medicine

## 2022-02-21 VITALS — BP 127/81 | HR 60 | Temp 98.1°F | Ht 64.0 in | Wt 210.8 lb

## 2022-02-21 DIAGNOSIS — F17200 Nicotine dependence, unspecified, uncomplicated: Secondary | ICD-10-CM | POA: Diagnosis not present

## 2022-02-21 DIAGNOSIS — I10 Essential (primary) hypertension: Secondary | ICD-10-CM

## 2022-02-21 DIAGNOSIS — G4733 Obstructive sleep apnea (adult) (pediatric): Secondary | ICD-10-CM | POA: Diagnosis not present

## 2022-02-21 NOTE — Patient Instructions (Signed)
No change in medications  Return Dr Delford Field 4 months  A home sleep study will be obtained

## 2022-02-21 NOTE — Assessment & Plan Note (Signed)
Needs sleep study will obtain now that she has insurance

## 2022-02-21 NOTE — Progress Notes (Signed)
Established Patient Office Visit  Subjective   Patient ID: Sarah Small, female    DOB: 07-17-1990  Age: 31 y.o. MRN: [id number]  Chief Complaint  Patient presents with   Hypertension    Sarah Small is a 31 year old female with history of hypertension, obstructive sleep apnea and tobacco use. She presents today with her wife for follow up.   01/23/22 She was referred to cardiology for hypertension and intermittent palpations. They performed an echocardiogram and a cardiac monitor trial- results below. They also suggested she increase her daily carvedilol to 25 mg twice a day. She has not done this yet and is still taking 12.5 mg BID. Her blood pressure today is 146/95. She does take her blood pressure at home and reports her numbers run around this as well. Other blood pressure medications include: amlodipine 10 mg daily, aldactone 25 mg daily and valsartan 320 mg daily. She does still feel intermittent palpitations, most recently last night, during activity with her wife. She reports that her wife calming her down and instructing her to breathe alleviated the symptoms.   She was evaluatated by ENT for witnessed apneas, daytime fatigue and elongated uvula. They ordered a sleep study through the company [employer]. Unfortunately, patient did not sleep long enough for adequate data and will need to repeat this. However, she can not financially afford the fee at this time. Her wife reports they are obtaining [place] insurance soon and is wondering if there is another option to order the study.   She is still smoking cigarettes and is interested in the nicotine patches. Reports cardiology prescribed these for her but she has not started because it is not the correct level for the amount she smokes. Notes a recent increase in use due to increase in stress with work.  She has been experiencing a hand and foot rash with itching. Rash occurs on the dorsal side of hands and the plantar surface of her feet.  Denies any new cleaning or beauty products.   9/7 Patient seen in return follow-up blood pressure on arrival 127/81 doing well with her blood pressure compared to prior numbers.  She has been compliant with her medications for blood pressure which include the amlodipine 10 mg daily valsartan 320 mg daily and Aldactone 25 mg daily  Patient does states she will be moving to [place] next summer.  Patient now has [employer] and would like to proceed with a home sleep study.  She is smoking the same amount of cigarettes as before.  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 Social History   Tobacco Use   Smoking status: Every Day    Packs/day: 0.25    Years: 9.00    Total pack years: 2.25    Types: Cigarettes   Smokeless tobacco: Never  Substance Use Topics  Alcohol use: No   Drug use: Yes    Types: Marijuana    Comment: twice a week    Family History  Problem Relation Age of Onset   Heart attack Mother    Hypertension Mother    Heart failure Mother        transplant   Allergies  Allergen Reactions   Ritalin [Methylphenidate Hcl] Rash      Review of Systems  Constitutional:  Negative for chills and fever.  HENT:  Negative for congestion, hearing loss and sore throat.   Eyes: Negative.   Respiratory:  Negative  for cough and sputum production.   Cardiovascular:  Negative for chest pain and leg swelling.  Gastrointestinal: Negative.   Genitourinary: Negative.   Musculoskeletal: Negative.   Skin:  Negative for itching and rash.  Neurological: Negative.   Endo/Heme/Allergies: Negative.   Psychiatric/Behavioral: Negative.        Objective:     BP 127/81   Pulse 60   Temp 98.1 F (36.7 C) (Oral)   Ht 5\' 4"  (1.626 m)   Wt 210 lb 12.8 oz (95.6 kg)   LMP 12/25/2021 (Exact Date)   SpO2 99%   BMI 36.18 kg/m     Physical Exam Constitutional:      Appearance: She is obese.  HENT:     Right Ear: Tympanic membrane, ear canal and external ear normal.     Left Ear: Tympanic membrane, ear canal and external ear normal.     Mouth/Throat:     Mouth: Mucous membranes are moist.     Pharynx: Oropharynx is clear.  Eyes:     Conjunctiva/sclera: Conjunctivae normal.  Cardiovascular:     Rate and Rhythm: Normal rate and regular rhythm.     Pulses: Normal pulses.     Heart sounds: Normal heart sounds.  Pulmonary:     Effort: Pulmonary effort is normal.     Breath sounds: Normal breath sounds.  Abdominal:     General: Bowel sounds are normal.     Palpations: Abdomen is soft.  Skin:    General: Skin is warm.     Capillary Refill: Capillary refill takes less than 2 seconds.     Findings: No rash.  Neurological:     Mental Status: She is alert and oriented to person, place, and time.  Psychiatric:        Mood and Affect: Mood normal.        Behavior: Behavior normal.   RECENT CV STUDIES Echocardiogram 11/08/2021   1. Left ventricular ejection fraction, by estimation, is 55 to 60%. The  left ventricle has normal function. The left ventricle has no regional  wall motion abnormalities. Left ventricular diastolic parameters were  normal. The average left ventricular  global longitudinal strain is -15.8 %. The global longitudinal strain is  abnormal.   2. Right ventricular systolic function  is normal. The right ventricular  size is normal.   3. Left atrial size was mildly dilated.   4. The mitral valve is normal in structure. Trivial mitral valve  regurgitation. No evidence of mitral stenosis.   5. The aortic valve is tricuspid. Aortic valve regurgitation is not  visualized. No aortic stenosis is present.   6. The inferior vena cava is normal in size with greater than 50%  respiratory variability, suggesting right atrial pressure of 3 mmHg.     20 Day Event Monitor 12/07/2021   Quality: Fair.  Baseline artifact. Predominant rhythm: sinus rhythm Average heart  rate: 78 bpm Max heart rate: 131 bpm Min heart rate: 52 bpm Pauses >2.5 seconds: none Occasional PVCs   No results found for any visits on 02/21/22.     The ASCVD Risk score (Arnett DK, et al., 2019) failed to calculate for the following reasons:   The 2019 ASCVD risk score is only valid for ages 21 to 36    Assessment & Plan:   Problem List Items Addressed This Visit       Cardiovascular and Mediastinum   Essential hypertension    Hypertension markedly improved  Continue with current medications  Obtain home sleep study        Respiratory   OSA (obstructive sleep apnea) - Primary    Needs sleep study will obtain now that she has insurance      Relevant Orders   Home sleep test   Home sleep test     Other   Tobacco dependency       Current smoking consumption amount: 1PPD Dicsussion on advise to quit smoking and smoking impacts: Cardiovascular impacts  Patient's willingness to quit: Wants to quit  Methods to quit smoking discussed: Nicotine replacement  Medication management of smoking session drugs discussed: Nicotine replacement Nicotine patch 21 mg/24 hr sent to pharmacy today, advised patient to use these instead of the 14mg /24hr ones previously sent by cardiology  Nicotine lozenges sent to pharmacy as well   Resources provided:  AVS   Setting quit date not  established  Follow-up arranged four months  Time spent counseling the patient: 5 minutes        Return in about 4 months (around 06/23/2022) for htn.    08/22/2022, MD

## 2022-02-21 NOTE — Assessment & Plan Note (Addendum)
    Current smoking consumption amount: 1PPD Dicsussion on advise to quit smoking and smoking impacts: Cardiovascular impacts  Patient's willingness to quit: Wants to quit  Methods to quit smoking discussed: Nicotine replacement  Medication management of smoking session drugs discussed: Nicotine replacement Nicotine patch 21 mg/24 hr sent to pharmacy today, advised patient to use these instead of the 14mg/24hr ones previously sent by cardiology  Nicotine lozenges sent to pharmacy as well   Resources provided:  AVS   Setting quit date not established  Follow-up arranged four months  Time spent counseling the patient: 5 minutes  

## 2022-02-21 NOTE — Assessment & Plan Note (Signed)
Hypertension markedly improved  Continue with current medications  Obtain home sleep study

## 2022-04-03 ENCOUNTER — Ambulatory Visit (HOSPITAL_BASED_OUTPATIENT_CLINIC_OR_DEPARTMENT_OTHER): Payer: Commercial Managed Care - HMO | Attending: Critical Care Medicine | Admitting: Internal Medicine

## 2022-04-03 VITALS — Ht 64.0 in | Wt 208.0 lb

## 2022-04-03 DIAGNOSIS — G4733 Obstructive sleep apnea (adult) (pediatric): Secondary | ICD-10-CM | POA: Diagnosis present

## 2022-04-03 DIAGNOSIS — R0683 Snoring: Secondary | ICD-10-CM | POA: Insufficient documentation

## 2022-04-12 ENCOUNTER — Ambulatory Visit (HOSPITAL_BASED_OUTPATIENT_CLINIC_OR_DEPARTMENT_OTHER): Payer: 59 | Admitting: Cardiovascular Disease

## 2022-04-14 DIAGNOSIS — G4733 Obstructive sleep apnea (adult) (pediatric): Secondary | ICD-10-CM | POA: Diagnosis not present

## 2022-04-14 NOTE — Procedures (Signed)
     Patient Name: Sarah Small, Sarah Small Date: 04/03/2022 Gender: Female D.O.B: 1990-12-06 Age (years): 31 Referring Provider: Asencion Noble Height (inches): 39 Interpreting Physician: Baird Lyons MD, ABSM Weight (lbs): 210 RPSGT: Gerhard Perches BMI: 36 MRN: [id number] Neck Size: 15.00  CLINICAL INFORMATION Sleep Study Type: HST Indication for sleep study: OSA Epworth Sleepiness Score: 19  SLEEP STUDY TECHNIQUE A multi-channel overnight portable sleep study was performed. The channels recorded were: nasal airflow, thoracic respiratory movement, and oxygen saturation with a pulse oximetry. Snoring was also monitored.  MEDICATIONS Patient self administered medications include: none reported.  SLEEP ARCHITECTURE Patient was studied for 534.6 minutes. The sleep efficiency was 100.0 % and the patient was supine for 0%. The arousal index was 0.0 per hour.  RESPIRATORY PARAMETERS The overall AHI was 2.7 per hour, with a central apnea index of 0 per hour. The oxygen nadir was 94% during sleep.  CARDIAC DATA Mean heart rate during sleep was 69.2 bpm.  IMPRESSIONS - No significant obstructive sleep apnea occurred during this study (AHI = 2.7/h). - The patient had minimal or no oxygen desaturation during the study (Min O2 = 94%) - Patient snored .  DIAGNOSIS - Primary Snoring  RECOMMENDATIONS - Be careful with alcohol, sedatives and other CNS depressants that may worsen sleep apnea and disrupt normal sleep architecture. - Sleep hygiene should be reviewed to assess factors that may improve sleep quality. - Weight management and regular exercise should be initiated or continued. - If results are inconsistent with clinical impression, consider in-center sleep study.  [Electronically signed] 04/14/2022 12:28 PM  Baird Lyons MD, [place], American Board of Sleep Medicine NPI: [id number]                        [place],  [place]  ELECTRONICALLY SIGNED ON:  04/14/2022, 12:25 PM [place] PH: [phone]   FX: [phone] [place]

## 2022-04-15 NOTE — Progress Notes (Signed)
Let patient know her sleep study was NORMAL. No sleep apnea

## 2022-04-17 ENCOUNTER — Telehealth: Payer: Self-pay

## 2022-04-17 NOTE — Telephone Encounter (Signed)
Pt was called and vm was left, Information has been sent to nurse pool.   

## 2022-04-22 ENCOUNTER — Telehealth: Payer: Self-pay

## 2022-04-22 ENCOUNTER — Ambulatory Visit
Admission: EM | Admit: 2022-04-22 | Discharge: 2022-04-22 | Disposition: A | Discharge status: Home / Self Care | Payer: Commercial Managed Care - HMO | Attending: Physician Assistant | Admitting: Physician Assistant

## 2022-04-22 DIAGNOSIS — M79672 Pain in left foot: Secondary | ICD-10-CM | POA: Diagnosis not present

## 2022-04-22 MED ORDER — PREDNISONE 20 MG PO TABS: 40.0000 mg | ORAL_TABLET | Freq: Every day | ORAL | 0 refills | Status: AC

## 2022-04-22 NOTE — ED Provider Notes (Signed)
[facility]    CSN: [id number] Arrival date & time: 04/22/22  0804      History   Chief Complaint Chief Complaint  Patient presents with   toe hurts    HPI Sarah Small is a 31 y.o. female.   Patient here today for evaluation of pain to her left first MTP.  She reports that she first noticed pain a few days ago when she woke up.  She denies any injury that she is aware of.  Weightbearing makes pain worse.  She denies any numbness or tingling.  She has tried over-the-counter medication without significant relief.  Denies any history of gout.  The history is provided by the patient.    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 No pertinent surgical history.  OB History   No obstetric history on file.      Home Medications    Prior to Admission medications   Medication Sig Start Date End Date Taking? Authorizing Provider  predniSONE (DELTASONE) 20 MG tablet Take 2 tablets (40 mg total) by mouth daily with breakfast for 5 days. 04/22/22 04/27/22 Yes Tomi Bamberger, PA-C  albuterol (VENTOLIN HFA) 108 (90 Base) MCG/ACT inhaler Inhale 1-2 puffs into the lungs every 6 (six) hours as needed for wheezing or shortness of breath. 07/22/21   Dahlia Byes A, NP  amLODipine (NORVASC) 10 MG tablet TAKE 1 TABLET (10 MG TOTAL) BY MOUTH DAILY. 01/23/22 01/23/23  Storm Frisk, MD  carvedilol (COREG) 25 MG tablet Take 1 tablet (25 mg total) by mouth 2 (two) times daily. 12/27/21 06/25/22  Alver Sorrow, NP  clobetasol cream (TEMOVATE) 0.05 % Apply 1 Application topically 2 (two) times daily. To hands 01/23/22   Storm Frisk, MD  clotrimazole-betamethasone (LOTRISONE) cream Apply 1 Application topically daily. To feet between toes 01/23/22   Storm Frisk, MD  nicotine (NICODERM CQ - DOSED IN MG/24 HOURS) 21 mg/24hr patch Place 1 patch (21 mg total) onto the skin daily. 01/23/22   Storm Frisk, MD  nicotine polacrilex (NICORETTE MINI) 4 MG lozenge Use three times a day to quit smoking 01/23/22   Storm Frisk, MD  pantoprazole (PROTONIX) 40 MG tablet Take 1 tablet (40 mg total) by mouth daily. 07/23/21   Storm Frisk, MD  spironolactone (ALDACTONE) 25 MG tablet Take 1 tablet (25 mg total) by mouth daily. 01/23/22   Storm Frisk, MD  valsartan (DIOVAN) 320 MG tablet Take 1 tablet (320 mg total) by mouth daily. 01/23/22   Storm Frisk, MD    Family History Family History  Problem Relation Age of Onset   Heart attack Mother    Hypertension Mother    Heart failure Mother        transplant    Social History Social History   Tobacco Use   Smoking status: Every Day  Packs/day: 0.25    Years: 9.00    Total pack years: 2.25    Types: Cigarettes   Smokeless tobacco: Never  Substance Use Topics   Alcohol use: No   Drug use: Yes    Types: Marijuana    Comment: twice a week      Allergies   Ritalin [methylphenidate hcl]   Review of Systems Review of Systems  Constitutional:  Negative for chills and fever.  Eyes:  Negative for discharge and redness.  Gastrointestinal:  Negative for nausea and vomiting.  Musculoskeletal:  Positive for arthralgias and joint swelling.  Skin:  Negative for color change and wound.  Neurological:  Negative for numbness.     Physical Exam Triage Vital Signs ED Triage Vitals  Enc Vitals  Group     BP      Pulse      Resp      Temp      Temp src      SpO2      Weight      Height      Head Circumference      Peak Flow      Pain Score      Pain Loc      Pain Edu?      Excl. in GC?    No data found.  Updated Vital Signs BP (!) 155/80 (BP Location: Left Arm)   Pulse 63   Temp 97.7 F (36.5 C) (Oral)   Resp 14   SpO2 98%       Physical Exam Vitals and nursing note reviewed.  Constitutional:      General: She is not in acute distress.    Appearance: Normal appearance. She is not ill-appearing.  HENT:     Head: Normocephalic and atraumatic.  Eyes:     Conjunctiva/sclera: Conjunctivae normal.  Cardiovascular:     Rate and Rhythm: Normal rate.  Pulmonary:     Effort: Pulmonary effort is normal. No respiratory distress.  Musculoskeletal:     Comments: Diffuse swelling to left 1st MTP with TTP to same, decreased ROM of left toe due to pain  Neurological:     Mental Status: She is alert.  Psychiatric:        Mood and Affect: Mood normal.        Behavior: Behavior normal.        Thought Content: Thought content normal.      UC Treatments / Results  Labs (all labs ordered are listed, but only abnormal results are displayed) Labs Reviewed - No data to display  EKG   Radiology No results found.  Procedures Procedures (including critical care time)  Medications Ordered in UC Medications - No data to display  Initial Impression / Assessment and Plan / UC Course  I have reviewed the triage vital signs and the nursing notes.  Pertinent labs & imaging results that were available during my care of the patient were reviewed by me and considered in my medical decision making (see chart for details).   Presentation suspicious for gout- discussed same with patient. Will treat with steroid burst and recommended further evaluation if no improvement or if symptoms recur. Patient expresses understanding.    Final Clinical Impressions(s) / UC Diagnoses    Final diagnoses:  Pain in left foot   Discharge Instructions   None    ED Prescriptions     Medication Sig Dispense Auth. Provider   predniSONE (DELTASONE) 20 MG tablet Take 2  tablets (40 mg total) by mouth daily with breakfast for 5 days. 10 tablet Francene Finders, PA-C      PDMP not reviewed this encounter.   Francene Finders, PA-C 04/22/22 1006

## 2022-04-22 NOTE — Telephone Encounter (Signed)
Pt given sleep study results per notes of Dr. Annamaria Boots on 04/22/22. Pt verbalized understanding.

## 2022-04-22 NOTE — ED Triage Notes (Signed)
Pt c/o left ball of foot causing severe pain causing limited ROM in great toe. States has to walk on side of foot to avoid pain. Denies known trauma/injury   Onset ~ Saturday

## 2022-04-23 ENCOUNTER — Other Ambulatory Visit: Payer: Self-pay | Admitting: Critical Care Medicine

## 2022-04-23 NOTE — Telephone Encounter (Signed)
Dc'd 05/21/21 Dr Joya Gaskins  Requested Prescriptions  Refused Prescriptions Disp Refills   valsartan (DIOVAN) 160 MG tablet [Pharmacy Med Name: VALSARTAN 160MG  TABLETS] 90 tablet 3    Sig: TAKE 1 TABLET(160 MG) BY MOUTH DAILY     Cardiovascular:  Angiotensin Receptor Blockers Failed - 04/23/2022  4:10 PM      Failed - Cr in normal range and within 180 days    Creatinine, Ser  Date Value Ref Range Status  01/17/2020 0.82 0.57 - 1.00 mg/dL Final         Failed - K in normal range and within 180 days    Potassium  Date Value Ref Range Status  01/17/2020 3.8 3.5 - 5.2 mmol/L Final         Failed - Last BP in normal range    BP Readings from Last 1 Encounters:  04/22/22 (!) 155/80         Passed - Patient is not pregnant      Passed - Valid encounter within last 6 months    Recent Outpatient Visits           2 months ago Essential hypertension   [place], MD   3 months ago Essential hypertension   [place], MD   6 months ago Essential hypertension   [place], MD   9 months ago Essential hypertension   [place], MD   11 months ago OSA (obstructive sleep apnea)   [place], MD       Future Appointments             In 2 days Skeet Latch, MD [facility]   In 2 months Joya Gaskins Burnett Harry, MD [facility]

## 2022-04-25 ENCOUNTER — Ambulatory Visit (HOSPITAL_BASED_OUTPATIENT_CLINIC_OR_DEPARTMENT_OTHER): Payer: 59 | Admitting: Cardiovascular Disease

## 2022-06-23 NOTE — Progress Notes (Unsigned)
Established Patient Office Visit  Subjective   Patient ID: Sarah Small, female    DOB: 11/26/1990  Age: 32 y.o. MRN: [id number]  No chief complaint on file.   Ms Frankowski is a 32 year old female with history of hypertension, obstructive sleep apnea and tobacco use. She presents today with her wife for follow up.   She was referred to cardiology for hypertension and intermittent palpations. They performed an echocardiogram and a cardiac monitor trial- results below. They also suggested she increase her daily carvedilol to 25 mg twice a day. She has not done this yet and is still taking 12.5 mg BID. Her blood pressure today is 146/95. She does take her blood pressure at home and reports her numbers run around this as well. Other blood pressure medications include: amlodipine 20 mg daily, aldactone 25 mg daily and valsartan 320 mg daily. She does still feel intermittent palpitations, most recently last night, during activity with her wife. She reports that her wife calming her down and instructing her to breathe alleviated the symptoms.   She was evaluatated by ENT for witnessed apneas, daytime fatigue and elongated uvula. They ordered a sleep study through the company [employer]. Unfortunately, patient did not sleep long enough for adequate data and will need to repeat this. However, she can not financially afford the fee at this time. Her wife reports they are obtaining [place] insurance soon and is wondering if there is another option to order the study.   She is still smoking cigarettes and is interested in the nicotine patches. Reports cardiology prescribed these for her but she has not started because it is not the correct level for the amount she smokes. Notes a recent increase in use due to increase in stress with work.  She has been experiencing a hand and foot rash with itching. Rash occurs on the dorsal side of hands and the plantar surface of her feet. Denies any new cleaning or beauty  products.     Patient Active Problem List   Diagnosis Date Noted  . Tinea pedis 01/23/2022  . Atopic dermatitis of both hands 01/23/2022  . 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 Palpitations 07/23/2021  . Neck pain, bilateral 07/23/2021  . 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 OSA (obstructive sleep apnea) 05/21/2021  . Chronic abdominal pain 06/29/2020  . Gastroesophageal reflux disease without esophagitis 06/29/2020  . Menorrhagia with regular cycle 06/29/2020  . BMI 33.0-33.9,adult 06/29/2020  . Tobacco dependency 02/24/2020  . Essential hypertension 01/17/2020   Past Medical History:  Diagnosis Date  . 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 Hypertension    No past surgical history on file. Social History   Tobacco Use  . Smoking status: Every Day    Packs/day: 0.25    Years: 9.00    Total pack years: 2.25    Types: Cigarettes  . Smokeless tobacco: Never  Substance Use Topics  . Alcohol use: No  . Drug use: Yes    Types: Marijuana    Comment: twice a week    Family History  Problem Relation Age of Onset  . Heart attack Mother   . Hypertension Mother   . Heart failure Mother        transplant   Allergies  Allergen Reactions  . Ritalin [Methylphenidate Hcl] Rash      Review of Systems  Constitutional:  Negative for chills and fever.  HENT:  Negative for congestion, hearing loss and sore throat.   Eyes: Negative.  Respiratory:  Negative for cough and sputum production.   Cardiovascular:  Negative for chest pain and leg swelling.  Gastrointestinal: Negative.   Genitourinary: Negative.   Musculoskeletal: Negative.   Skin:  Positive for itching and rash.  Neurological: Negative.   Endo/Heme/Allergies: Negative.   Psychiatric/Behavioral: Negative.        Objective:     There were no vitals taken for this visit.    Physical Exam Constitutional:      Appearance: She is obese.  HENT:     Right Ear: Tympanic membrane, ear canal and external ear normal.      Left Ear: Tympanic membrane, ear canal and external ear normal.     Mouth/Throat:     Mouth: Mucous membranes are moist.     Pharynx: Oropharynx is clear.  Eyes:     Conjunctiva/sclera: Conjunctivae normal.  Cardiovascular:     Rate and Rhythm: Normal rate and regular rhythm.     Pulses: Normal pulses.     Heart sounds: Normal heart sounds.  Pulmonary:     Effort: Pulmonary effort is normal.     Breath sounds: Normal breath sounds.  Abdominal:     General: Bowel sounds are normal.     Palpations: Abdomen is soft.  Skin:    General: Skin is warm.     Capillary Refill: Capillary refill takes less than 2 seconds.     Findings: Rash present.     Comments: Multiple scattered erythematous papules bilateral dorsal surface of hand Multiple small areas of hyperpigmentation bottom of bilateral feet  Fungal excoriations present both feet   Neurological:     Mental Status: She is alert and oriented to person, place, and time.  Psychiatric:        Mood and Affect: Mood normal.        Behavior: Behavior normal.  RECENT CV STUDIES Echocardiogram 11/08/2021   1. Left ventricular ejection fraction, by estimation, is 55 to 60%. The  left ventricle has normal function. The left ventricle has no regional  wall motion abnormalities. Left ventricular diastolic parameters were  normal. The average left ventricular  global longitudinal strain is -15.8 %. The global longitudinal strain is  abnormal.   2. Right ventricular systolic function is normal. The right ventricular  size is normal.   3. Left atrial size was mildly dilated.   4. The mitral valve is normal in structure. Trivial mitral valve  regurgitation. No evidence of mitral stenosis.   5. The aortic valve is tricuspid. Aortic valve regurgitation is not  visualized. No aortic stenosis is present.   6. The inferior vena cava is normal in size with greater than 50%  respiratory variability, suggesting right atrial pressure of 3 mmHg.      20 Day Event Monitor 12/07/2021   Quality: Fair.  Baseline artifact. Predominant rhythm: sinus rhythm Average heart rate: 78 bpm Max heart rate: 131 bpm Min heart rate: 52 bpm Pauses >2.5 seconds: none Occasional PVCs   No results found for any visits on 06/25/22.     The ASCVD Risk score (Arnett DK, et al., 2019) failed to calculate for the following reasons:   The 2019 ASCVD risk score is only valid for ages 15 to 77    Assessment & Plan:   Problem List Items Addressed This Visit   None 38 min spent on history /physical and patient education  No follow-ups on file.    Asencion Noble, MD

## 2022-06-25 ENCOUNTER — Encounter: Payer: Self-pay | Admitting: Critical Care Medicine

## 2022-06-25 ENCOUNTER — Ambulatory Visit: Payer: Commercial Managed Care - HMO | Attending: Critical Care Medicine | Admitting: Critical Care Medicine

## 2022-06-25 VITALS — BP 143/85 | HR 66 | Ht 64.0 in | Wt 218.0 lb

## 2022-06-25 DIAGNOSIS — F17200 Nicotine dependence, unspecified, uncomplicated: Secondary | ICD-10-CM

## 2022-06-25 DIAGNOSIS — I10 Essential (primary) hypertension: Secondary | ICD-10-CM

## 2022-06-25 DIAGNOSIS — G4733 Obstructive sleep apnea (adult) (pediatric): Secondary | ICD-10-CM | POA: Insufficient documentation

## 2022-06-25 DIAGNOSIS — Z79899 Other long term (current) drug therapy: Secondary | ICD-10-CM | POA: Insufficient documentation

## 2022-06-25 DIAGNOSIS — F32A Depression, unspecified: Secondary | ICD-10-CM | POA: Diagnosis not present

## 2022-06-25 DIAGNOSIS — F419 Anxiety disorder, unspecified: Secondary | ICD-10-CM | POA: Diagnosis not present

## 2022-06-25 MED ORDER — VALSARTAN 320 MG PO TABS: 320.0000 mg | ORAL_TABLET | Freq: Every day | ORAL | 3 refills | Status: DC

## 2022-06-25 MED ORDER — ESCITALOPRAM OXALATE 10 MG PO TABS: 10.0000 mg | ORAL_TABLET | Freq: Every day | ORAL | 3 refills | Status: DC

## 2022-06-25 MED ORDER — SPIRONOLACTONE 50 MG PO TABS: 50.0000 mg | ORAL_TABLET | Freq: Every day | ORAL | 1 refills | Status: DC

## 2022-06-25 MED ORDER — AMLODIPINE BESYLATE 10 MG PO TABS: ORAL_TABLET | Freq: Every day | ORAL | 3 refills | Status: DC

## 2022-06-25 MED ORDER — PANTOPRAZOLE SODIUM 40 MG PO TBEC: 40.0000 mg | DELAYED_RELEASE_TABLET | Freq: Every day | ORAL | 3 refills | Status: DC

## 2022-06-25 MED ORDER — CARVEDILOL 25 MG PO TABS: 25.0000 mg | ORAL_TABLET | Freq: Two times a day (BID) | ORAL | 5 refills | Status: DC

## 2022-06-25 MED ORDER — CLOBETASOL PROPIONATE 0.05 % EX CREA: 1.0000 | TOPICAL_CREAM | Freq: Two times a day (BID) | CUTANEOUS | 0 refills | Status: DC

## 2022-06-25 NOTE — Assessment & Plan Note (Signed)
Sleep study showed no sleep apnea this problem is completed

## 2022-06-25 NOTE — Assessment & Plan Note (Signed)
Hypertension not well-controlled plan is to increase amlodipine to 10 mg daily continue carvedilol 25 mg twice daily increase valsartan to 320 mg daily and continue spironolactone but an increased dose at 50 mg daily she will see clinical pharmacy short-term follow-up and we will check labs this visit

## 2022-06-25 NOTE — Assessment & Plan Note (Signed)
    Current smoking consumption amount: 1PPD Dicsussion on advise to quit smoking and smoking impacts: Cardiovascular impacts  Patient's willingness to quit: Wants to quit  Methods to quit smoking discussed: Nicotine replacement  Medication management of smoking session drugs discussed: Nicotine replacement Nicotine patch 21 mg/24 hr sent to pharmacy today, advised patient to use these instead of the 14mg /24hr ones previously sent by cardiology  Nicotine lozenges sent to pharmacy as well   Resources provided:  AVS   Setting quit date not established  Follow-up arranged four months  Time spent counseling the patient: 5 minutes

## 2022-06-25 NOTE — Assessment & Plan Note (Addendum)
Severe anxiety and depression plan for this patient is to begin Lexapro 10 mg daily and will follow this patient up closely she does not have a plan for suicide she is low risk  The patient does need to achieve a job during the daytime a letter will be issued

## 2022-06-25 NOTE — Patient Instructions (Signed)
Start Aldactone 50 mg daily can take 2 of the 25 mg till gone the refill be a single 50 mg daily  All other medications unchanged refill sent  Labs today include metabolic panel blood count  Return to see Sarah Small our clinical pharmacist 6 weeks for blood pressure follow-up return to Dr. [name] 3 months  Letter to your employer will be prepared and will be mailed to your home regarding her job changed to daytime work  Work on tobacco reduction  Start Lexapro 1 daily for anxiety and depression

## 2022-06-26 LAB — COMPREHENSIVE METABOLIC PANEL
ALT: 18 IU/L (ref 0–32)
AST: 18 IU/L (ref 0–40)
Albumin/Globulin Ratio: 1.5 (ref 1.2–2.2)
Albumin: 3.8 g/dL — ABNORMAL LOW (ref 3.9–4.9)
Alkaline Phosphatase: 89 IU/L (ref 44–121)
BUN/Creatinine Ratio: 12 (ref 9–23)
BUN: 9 mg/dL (ref 6–20)
Bilirubin Total: 0.3 mg/dL (ref 0.0–1.2)
CO2: 22 mmol/L (ref 20–29)
Calcium: 9 mg/dL (ref 8.7–10.2)
Chloride: 107 mmol/L — ABNORMAL HIGH (ref 96–106)
Creatinine, Ser: 0.75 mg/dL (ref 0.57–1.00)
Globulin, Total: 2.5 g/dL (ref 1.5–4.5)
Glucose: 84 mg/dL (ref 70–99)
Potassium: 4.3 mmol/L (ref 3.5–5.2)
Sodium: 140 mmol/L (ref 134–144)
Total Protein: 6.3 g/dL (ref 6.0–8.5)
eGFR: 109 mL/min/{1.73_m2} (ref >59)

## 2022-06-26 LAB — CBC WITH DIFFERENTIAL/PLATELET
Basophils Absolute: 0.1 10*3/uL (ref 0.0–0.2)
Basos: 1 %
EOS (ABSOLUTE): 0.1 10*3/uL (ref 0.0–0.4)
Eos: 1 %
Hematocrit: 35.4 % (ref 34.0–46.6)
Hemoglobin: 10.9 g/dL — ABNORMAL LOW (ref 11.1–15.9)
Immature Grans (Abs): 0 10*3/uL (ref 0.0–0.1)
Immature Granulocytes: 0 %
Lymphocytes Absolute: 3.4 10*3/uL — ABNORMAL HIGH (ref 0.7–3.1)
Lymphs: 37 %
MCH: 23.8 pg — ABNORMAL LOW (ref 26.6–33.0)
MCHC: 30.8 g/dL — ABNORMAL LOW (ref 31.5–35.7)
MCV: 77 fL — ABNORMAL LOW (ref 79–97)
Monocytes Absolute: 0.6 10*3/uL (ref 0.1–0.9)
Monocytes: 7 %
Neutrophils Absolute: 5 10*3/uL (ref 1.4–7.0)
Neutrophils: 54 %
Platelets: 281 10*3/uL (ref 150–450)
RBC: 4.58 x10E6/uL (ref 3.77–5.28)
RDW: 14.2 % (ref 11.7–15.4)
WBC: 9.2 10*3/uL (ref 3.4–10.8)

## 2022-06-26 NOTE — Progress Notes (Signed)
Let pt know liver kidny normal, blood count stable no change in medications

## 2022-06-27 ENCOUNTER — Telehealth: Payer: Self-pay

## 2022-06-27 NOTE — Telephone Encounter (Signed)
Pt wife was called who is on DPR  and is aware of results, DOB was confirmed.

## 2022-06-27 NOTE — Telephone Encounter (Signed)
-----   Message from Elsie Stain, MD sent at 06/26/2022  5:53 AM EST ----- Let pt know liver kidny normal, blood count stable no change in medications

## 2022-08-08 ENCOUNTER — Ambulatory Visit: Payer: Commercial Managed Care - HMO | Admitting: Pharmacist

## 2022-09-18 ENCOUNTER — Telehealth: Payer: Self-pay | Admitting: Critical Care Medicine

## 2022-09-18 NOTE — Telephone Encounter (Signed)
Call pt and see what her recent home bp reading has been and she will need return OV for bp check

## 2022-09-19 ENCOUNTER — Ambulatory Visit: Payer: Commercial Managed Care - HMO | Admitting: Critical Care Medicine

## 2022-09-19 NOTE — Progress Notes (Deleted)
Established Patient Office Visit  Subjective   Patient ID: Sarah Small, female    DOB: December 03, 1990  Age: 32 y.o. MRN: [id number]  No chief complaint on file.   02/2022 Sarah Small is a 32 year old female with history of hypertension, obstructive sleep apnea and tobacco use. She presents today with her wife for follow up.   She was referred to cardiology for hypertension and intermittent palpations. They performed an echocardiogram and a cardiac monitor trial- results below. They also suggested she increase her daily carvedilol to 25 mg twice a day. She has not done this yet and is still taking 12.5 mg BID. Her blood pressure today is 146/95. She does take her blood pressure at home and reports her numbers run around this as well. Other blood pressure medications include: amlodipine 20 mg daily, aldactone 25 mg daily and valsartan 320 mg daily. She does still feel intermittent palpitations, most recently last night, during activity with her wife. She reports that her wife calming her down and instructing her to breathe alleviated the symptoms.   She was evaluatated by ENT for witnessed apneas, daytime fatigue and elongated uvula. They ordered a sleep study through the [facility]. Unfortunately, patient did not sleep long enough for adequate data and will need to repeat this. However, she can not financially afford the fee at this time. Her wife reports they are obtaining [place] insurance soon and is wondering if there is another option to order the study.   She is still smoking cigarettes and is interested in the nicotine patches. Reports cardiology prescribed these for her but she has not started because it is not the correct level for the amount she smokes. Notes a recent increase in use due to increase in stress with work.  She has been experiencing a hand and foot rash with itching. Rash occurs on the dorsal side of hands and the plantar surface of her feet. Denies any new cleaning or  beauty products.   06/25/22 Patient seen on return follow-up note since last visit she had a sleep study which was normal.  She still smoking 5 cigarettes daily.  On arrival blood pressure elevated 143/85 it is rechecked and accurate.  She states at home she runs the same numbers.  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  Patient would like a letter to her employer to see if she can be switched to a day job because of the agitation and its effect on her health.  She has no other real complaints at this visit.  She did score very high on her GAD-7 and PHQ-9 and is not on mental health medications at this time. Essential hypertension - Primary       Hypertension not well-controlled plan is to increase amlodipine to 10 mg daily continue carvedilol 25 mg twice daily increase valsartan to 320 mg daily and continue spironolactone but an increased dose at 50 mg daily she will see clinical pharmacy short-term follow-up and we will check labs this visit      Relevant Medications   amLODipine (NORVASC) 10 MG tablet   carvedilol (COREG) 25 MG tablet   spironolactone (ALDACTONE) 50 MG tablet   valsartan (DIOVAN) 320 MG tablet      Other   Anxiety and depression      Severe anxiety and depression plan for  this patient is to begin Lexapro 10 mg daily and will follow this patient up closely she does not have a plan for suicide she is low risk   The patient does need to achieve a job during the daytime a letter will be issued      Relevant Medications   escitalopram (LEXAPRO) 10 MG tablet   Other Visit Diagnoses      Primary hypertension       Relevant Medications   amLODipine (NORVASC) 10 MG tablet   carvedilol (COREG) 25 MG tablet   spironolactone (ALDACTONE) 50 MG tablet   valsartan (DIOVAN) 320 MG tablet   Other Relevant Orders   Comprehensive metabolic panel   CBC with  Differential/Platelet   09/19/22   Patient Active Problem List   Diagnosis Date Noted  . Anxiety and depression 06/25/2022  . Tinea pedis 01/23/2022  . Atopic dermatitis of both hands 01/23/2022  . 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 Palpitations 07/23/2021  . Neck pain, bilateral 07/23/2021  . 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 Chronic abdominal pain 06/29/2020  . Gastroesophageal reflux disease without esophagitis 06/29/2020  . Menorrhagia with regular cycle 06/29/2020  . BMI 33.0-33.9,adult 06/29/2020  . Tobacco dependency 02/24/2020  . Essential hypertension 01/17/2020   Past Medical History:  Diagnosis Date  . 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 Hypertension    No past surgical history on file. Social History   Tobacco Use  . Smoking status: Every Day    Packs/day: 0.25    Years: 9.00    Additional pack years: 0.00    Total pack years: 2.25    Types: Cigarettes  . Smokeless tobacco: Never  Substance Use Topics  . Alcohol use: No  . Drug use: Yes    Types: Marijuana    Comment: twice a week    Family History  Problem Relation Age of Onset  . Heart attack Mother   . Hypertension Mother   . Heart failure Mother        transplant   Allergies  Allergen Reactions  . Ritalin [Methylphenidate Hcl] Rash      Review of Systems  Constitutional:  Negative for chills and fever.  HENT:  Negative for congestion, hearing loss and sore throat.   Eyes: Negative.   Respiratory:  Negative for cough and sputum production.   Cardiovascular:  Negative for chest pain and leg swelling.  Gastrointestinal: Negative.   Genitourinary: Negative.   Musculoskeletal: Negative.   Skin:  Negative for itching and rash.  Neurological: Negative.   Endo/Heme/Allergies: Negative.   Psychiatric/Behavioral:  Positive for depression and suicidal ideas. The patient is nervous/anxious and has insomnia.       Objective:     There were no vitals taken for this visit.    Physical  Exam Vitals reviewed.  Constitutional:      Appearance: Normal appearance. She is well-developed. She is obese. She is not diaphoretic.  HENT:     Head: Normocephalic and atraumatic.     Right Ear: Tympanic membrane, ear canal and external ear normal.     Left Ear: Tympanic membrane, ear canal and external ear normal.     Nose: No nasal deformity, septal deviation, mucosal edema or rhinorrhea.     Right Sinus: No maxillary sinus tenderness or frontal sinus tenderness.     Left Sinus: No maxillary sinus tenderness or frontal sinus tenderness.     Mouth/Throat:     Mouth: Mucous membranes are moist.  Pharynx: Oropharynx is clear. No oropharyngeal exudate.  Eyes:     General: No scleral icterus.    Conjunctiva/sclera: Conjunctivae normal.     Pupils: Pupils are equal, round, and reactive to light.  Neck:     Thyroid: No thyromegaly.     Vascular: No carotid bruit or JVD.     Trachea: Trachea normal. No tracheal tenderness or tracheal deviation.  Cardiovascular:     Rate and Rhythm: Normal rate and regular rhythm.     Chest Wall: PMI is not displaced.     Pulses: Normal pulses. No decreased pulses.     Heart sounds: Normal heart sounds, S1 normal and S2 normal. Heart sounds not distant. No murmur heard.    No systolic murmur is present.     No diastolic murmur is present.     No friction rub. No gallop. No S3 or S4 sounds.  Pulmonary:     Effort: Pulmonary effort is normal. No tachypnea, accessory muscle usage or respiratory distress.     Breath sounds: Normal breath sounds. No stridor. No decreased breath sounds, wheezing, rhonchi or rales.  Chest:     Chest wall: No tenderness.  Abdominal:     General: Bowel sounds are normal. There is no distension.     Palpations: Abdomen is soft. Abdomen is not rigid.     Tenderness: There is no abdominal tenderness. There is no guarding or rebound.  Musculoskeletal:        General: Normal range of motion.     Cervical back: Normal range  of motion and neck supple. No edema, erythema or rigidity. No muscular tenderness. Normal range of motion.  Lymphadenopathy:     Head:     Right side of head: No submental or submandibular adenopathy.     Left side of head: No submental or submandibular adenopathy.     Cervical: No cervical adenopathy.  Skin:    General: Skin is warm and dry.     Capillary Refill: Capillary refill takes less than 2 seconds.     Coloration: Skin is not pale.     Findings: No rash.     Nails: There is no clubbing.     Comments: Multiple scattered erythematous papules bilateral dorsal surface of hand Multiple small areas of hyperpigmentation bottom of bilateral feet  Fungal excoriations present both feet   Neurological:     Mental Status: She is alert and oriented to person, place, and time.     Sensory: No sensory deficit.  Psychiatric:        Attention and Perception: Attention and perception normal.        Mood and Affect: Mood is anxious. Affect is tearful.        Speech: Speech normal.        Behavior: Behavior normal.        Thought Content: Thought content is not paranoid or delusional. Thought content includes suicidal ideation. Thought content does not include homicidal ideation. Thought content does not include homicidal or suicidal plan.        Cognition and Memory: Cognition and memory normal.        Judgment: Judgment normal.  RECENT CV STUDIES Echocardiogram 11/08/2021   1. Left ventricular ejection fraction, by estimation, is 55 to 60%. The  left ventricle has normal function. The left ventricle has no regional  wall motion abnormalities. Left ventricular diastolic parameters were  normal. The average left ventricular  global longitudinal strain is -15.8 %. The global  longitudinal strain is  abnormal.   2. Right ventricular systolic function is normal. The right ventricular  size is normal.   3. Left atrial size was mildly dilated.   4. The mitral valve is normal in structure.  Trivial mitral valve  regurgitation. No evidence of mitral stenosis.   5. The aortic valve is tricuspid. Aortic valve regurgitation is not  visualized. No aortic stenosis is present.   6. The inferior vena cava is normal in size with greater than 50%  respiratory variability, suggesting right atrial pressure of 3 mmHg.     20 Day Event Monitor 12/07/2021   Quality: Fair.  Baseline artifact. Predominant rhythm: sinus rhythm Average heart rate: 78 bpm Max heart rate: 131 bpm Min heart rate: 52 bpm Pauses >2.5 seconds: none Occasional PVCs   No results found for any visits on 09/19/22.     The ASCVD Risk score (Arnett DK, et al., 2019) failed to calculate for the following reasons:   The 2019 ASCVD risk score is only valid for ages 36 to 9    Assessment & Plan:   Problem List Items Addressed This Visit   None 38 min spent on history /physical and patient education  No follow-ups on file.    Asencion Noble, MD

## 2022-09-20 NOTE — Telephone Encounter (Signed)
Called and got patient appointment for next week

## 2022-09-22 NOTE — Progress Notes (Deleted)
Established Patient Office Visit  Subjective   Patient ID: Sarah Small, female    DOB: [birth year]  Age: 32 y.o. MRN: [id number]  No chief complaint on file.   02/2022 Ms Coull is a 32 year old female with history of hypertension, obstructive sleep apnea and tobacco use. She presents today with her wife for follow up.   She was referred to cardiology for hypertension and intermittent palpations. They performed an echocardiogram and a cardiac monitor trial- results below. They also suggested she increase her daily carvedilol to 25 mg twice a day. She has not done this yet and is still taking 12.5 mg BID. Her blood pressure today is 146/95. She does take her blood pressure at home and reports her numbers run around this as well. Other blood pressure medications include: amlodipine 20 mg daily, aldactone 25 mg daily and valsartan 320 mg daily. She does still feel intermittent palpitations, most recently last night, during activity with her wife. She reports that her wife calming her down and instructing her to breathe alleviated the symptoms.   She was evaluatated by ENT for witnessed apneas, daytime fatigue and elongated uvula. They ordered a sleep study through the company [facility]. Unfortunately, patient did not sleep long enough for adequate data and will need to repeat this. However, she can not financially afford the fee at this time. Her wife reports they are obtaining [place] insurance soon and is wondering if there is another option to order the study.   She is still smoking cigarettes and is interested in the nicotine patches. Reports cardiology prescribed these for her but she has not started because it is not the correct level for the amount she smokes. Notes a recent increase in use due to increase in stress with work.  She has been experiencing a hand and foot rash with itching. Rash occurs on the dorsal side of hands and the plantar surface of her feet. Denies any new cleaning or  beauty products.   06/25/22 Patient seen on return follow-up note since last visit she had a sleep study which was normal.  She still smoking 5 cigarettes daily.  On arrival blood pressure elevated 143/85 it is rechecked and accurate.  She states at home she runs the same numbers.  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  Patient would like a letter to her employer to see if she can be switched to a day job because of the agitation and its effect on her health.  She has no other real complaints at this visit.  She did score very high on her GAD-7 and PHQ-9 and is not on mental health medications at this time.   Patient Active Problem List   Diagnosis Date Noted  . Anxiety and depression 06/25/2022  . Tinea pedis 01/23/2022  . Atopic dermatitis of both hands 01/23/2022  . 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 Palpitations 07/23/2021  . Neck pain, bilateral 07/23/2021  . 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 Chronic abdominal pain 06/29/2020  . Gastroesophageal reflux disease without esophagitis 06/29/2020  . Menorrhagia with regular cycle 06/29/2020  . BMI 33.0-33.9,adult 06/29/2020  . Tobacco dependency 02/24/2020  . Essential hypertension 01/17/2020   Past Medical History:  Diagnosis Date  . 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 Hypertension    No past surgical history on file. Social History  Tobacco Use  . Smoking status: Every Day    Packs/day: 0.25    Years: 9.00    Additional pack years: 0.00    Total pack years: 2.25    Types: Cigarettes  . Smokeless tobacco: Never  Substance Use Topics  . Alcohol use: No  . Drug use: Yes    Types: Marijuana    Comment: twice a week    Family History  Problem Relation Age of Onset  . Heart attack Mother   . Hypertension Mother   . Heart failure Mother        transplant   Allergies  Allergen Reactions  . Ritalin  [Methylphenidate Hcl] Rash      Review of Systems  Constitutional:  Negative for chills and fever.  HENT:  Negative for congestion, hearing loss and sore throat.   Eyes: Negative.   Respiratory:  Negative for cough and sputum production.   Cardiovascular:  Negative for chest pain and leg swelling.  Gastrointestinal: Negative.   Genitourinary: Negative.   Musculoskeletal: Negative.   Skin:  Negative for itching and rash.  Neurological: Negative.   Endo/Heme/Allergies: Negative.   Psychiatric/Behavioral:  Positive for depression and suicidal ideas. The patient is nervous/anxious and has insomnia.       Objective:     There were no vitals taken for this visit.    Physical Exam Vitals reviewed.  Constitutional:      Appearance: Normal appearance. She is well-developed. She is obese. She is not diaphoretic.  HENT:     Head: Normocephalic and atraumatic.     Right Ear: Tympanic membrane, ear canal and external ear normal.     Left Ear: Tympanic membrane, ear canal and external ear normal.     Nose: No nasal deformity, septal deviation, mucosal edema or rhinorrhea.     Right Sinus: No maxillary sinus tenderness or frontal sinus tenderness.     Left Sinus: No maxillary sinus tenderness or frontal sinus tenderness.     Mouth/Throat:     Mouth: Mucous membranes are moist.     Pharynx: Oropharynx is clear. No oropharyngeal exudate.  Eyes:     General: No scleral icterus.    Conjunctiva/sclera: Conjunctivae normal.     Pupils: Pupils are equal, round, and reactive to light.  Neck:     Thyroid: No thyromegaly.     Vascular: No carotid bruit or JVD.     Trachea: Trachea normal. No tracheal tenderness or tracheal deviation.  Cardiovascular:     Rate and Rhythm: Normal rate and regular rhythm.     Chest Wall: PMI is not displaced.     Pulses: Normal pulses. No decreased pulses.     Heart sounds: Normal heart sounds, S1 normal and S2 normal. Heart sounds not distant. No murmur  heard.    No systolic murmur is present.     No diastolic murmur is present.     No friction rub. No gallop. No S3 or S4 sounds.  Pulmonary:     Effort: Pulmonary effort is normal. No tachypnea, accessory muscle usage or respiratory distress.     Breath sounds: Normal breath sounds. No stridor. No decreased breath sounds, wheezing, rhonchi or rales.  Chest:     Chest wall: No tenderness.  Abdominal:     General: Bowel sounds are normal. There is no distension.     Palpations: Abdomen is soft. Abdomen is not rigid.     Tenderness: There is no abdominal tenderness. There is no guarding  or rebound.  Musculoskeletal:        General: Normal range of motion.     Cervical back: Normal range of motion and neck supple. No edema, erythema or rigidity. No muscular tenderness. Normal range of motion.  Lymphadenopathy:     Head:     Right side of head: No submental or submandibular adenopathy.     Left side of head: No submental or submandibular adenopathy.     Cervical: No cervical adenopathy.  Skin:    General: Skin is warm and dry.     Capillary Refill: Capillary refill takes less than 2 seconds.     Coloration: Skin is not pale.     Findings: No rash.     Nails: There is no clubbing.     Comments: Multiple scattered erythematous papules bilateral dorsal surface of hand Multiple small areas of hyperpigmentation bottom of bilateral feet  Fungal excoriations present both feet   Neurological:     Mental Status: She is alert and oriented to person, place, and time.     Sensory: No sensory deficit.  Psychiatric:        Attention and Perception: Attention and perception normal.        Mood and Affect: Mood is anxious. Affect is tearful.        Speech: Speech normal.        Behavior: Behavior normal.        Thought Content: Thought content is not paranoid or delusional. Thought content includes suicidal ideation. Thought content does not include homicidal ideation. Thought content does not  include homicidal or suicidal plan.        Cognition and Memory: Cognition and memory normal.        Judgment: Judgment normal.  RECENT CV STUDIES Echocardiogram 11/08/2021   1. Left ventricular ejection fraction, by estimation, is 55 to 60%. The  left ventricle has normal function. The left ventricle has no regional  wall motion abnormalities. Left ventricular diastolic parameters were  normal. The average left ventricular  global longitudinal strain is -15.8 %. The global longitudinal strain is  abnormal.   2. Right ventricular systolic function is normal. The right ventricular  size is normal.   3. Left atrial size was mildly dilated.   4. The mitral valve is normal in structure. Trivial mitral valve  regurgitation. No evidence of mitral stenosis.   5. The aortic valve is tricuspid. Aortic valve regurgitation is not  visualized. No aortic stenosis is present.   6. The inferior vena cava is normal in size with greater than 50%  respiratory variability, suggesting right atrial pressure of 3 mmHg.     20 Day Event Monitor 12/07/2021   Quality: Fair.  Baseline artifact. Predominant rhythm: sinus rhythm Average heart rate: 78 bpm Max heart rate: 131 bpm Min heart rate: 52 bpm Pauses >2.5 seconds: none Occasional PVCs   No results found for any visits on 09/24/22.     The ASCVD Risk score (Arnett DK, et al., 2019) failed to calculate for the following reasons:   The 2019 ASCVD risk score is only valid for ages 2840 to 4279    Assessment & Plan:   Problem List Items Addressed This Visit   None 38 min spent on history /physical and patient education  No follow-ups on file.    Shan LevansPatrick Verena Shawgo, MD

## 2022-09-24 ENCOUNTER — Ambulatory Visit: Payer: Commercial Managed Care - HMO | Admitting: Critical Care Medicine

## 2022-10-29 ENCOUNTER — Telehealth: Payer: Self-pay

## 2022-10-29 NOTE — Telephone Encounter (Signed)
Patient attempted to be outreached by Thyra Breed, PharmD Candidate on 10/29/22 to discuss hypertension. Left voicemail for patient to return our call at their convenience at [phone].  Thyra Breed, Student-PharmD

## 2022-10-31 ENCOUNTER — Encounter: Payer: Self-pay | Admitting: Critical Care Medicine

## 2022-10-31 ENCOUNTER — Ambulatory Visit: Payer: Self-pay | Attending: Critical Care Medicine | Admitting: Critical Care Medicine

## 2022-10-31 ENCOUNTER — Other Ambulatory Visit: Payer: Self-pay

## 2022-10-31 VITALS — BP 138/78 | HR 65 | Ht 64.0 in | Wt 210.0 lb

## 2022-10-31 DIAGNOSIS — I10 Essential (primary) hypertension: Secondary | ICD-10-CM | POA: Insufficient documentation

## 2022-10-31 DIAGNOSIS — Z79899 Other long term (current) drug therapy: Secondary | ICD-10-CM | POA: Insufficient documentation

## 2022-10-31 DIAGNOSIS — G4733 Obstructive sleep apnea (adult) (pediatric): Secondary | ICD-10-CM | POA: Insufficient documentation

## 2022-10-31 DIAGNOSIS — F17200 Nicotine dependence, unspecified, uncomplicated: Secondary | ICD-10-CM

## 2022-10-31 DIAGNOSIS — F419 Anxiety disorder, unspecified: Secondary | ICD-10-CM

## 2022-10-31 DIAGNOSIS — F17210 Nicotine dependence, cigarettes, uncomplicated: Secondary | ICD-10-CM | POA: Insufficient documentation

## 2022-10-31 DIAGNOSIS — K089 Disorder of teeth and supporting structures, unspecified: Secondary | ICD-10-CM

## 2022-10-31 DIAGNOSIS — Z713 Dietary counseling and surveillance: Secondary | ICD-10-CM | POA: Insufficient documentation

## 2022-10-31 DIAGNOSIS — G8929 Other chronic pain: Secondary | ICD-10-CM | POA: Insufficient documentation

## 2022-10-31 DIAGNOSIS — F32A Depression, unspecified: Secondary | ICD-10-CM | POA: Insufficient documentation

## 2022-10-31 MED ORDER — TRAMADOL HCL 50 MG PO TABS
50.0000 mg | ORAL_TABLET | Freq: Three times a day (TID) | ORAL | 0 refills | Status: AC | PRN
  Filled 2022-10-31: qty 15, 5d supply, fill #0

## 2022-10-31 NOTE — Assessment & Plan Note (Signed)
Failed standard therapy will give short trial of tramadol and encouraged dental appointment as soon as she gets Medicaid

## 2022-10-31 NOTE — Progress Notes (Signed)
Established Patient Office Visit  Subjective   Patient ID: Sarah Small, female    DOB: [birth year]  Age: 32 y.o. MRN: [id number]  Chief Complaint  Patient presents with   Hypertension   Oral Pain    02/2022 [name] is a 32 year old female with history of hypertension, obstructive sleep apnea and tobacco use. She presents today with her wife for follow up.   She was referred to cardiology for hypertension and intermittent palpations. They performed an echocardiogram and a cardiac monitor trial- results below. They also suggested she increase her daily carvedilol to 25 mg twice a day. She has not done this yet and is still taking 12.5 mg BID. Her blood pressure today is 146/95. She does take her blood pressure at home and reports her numbers run around this as well. Other blood pressure medications include: amlodipine 20 mg daily, aldactone 25 mg daily and valsartan 320 mg daily. She does still feel intermittent palpitations, most recently last night, during activity with her wife. She reports that her wife calming her down and instructing her to breathe alleviated the symptoms.   She was evaluatated by ENT for witnessed apneas, daytime fatigue and elongated uvula. They ordered a sleep study through the company [employer]. Unfortunately, patient did not sleep long enough for adequate data and will need to repeat this. However, she can not financially afford the fee at this time. Her wife reports they are obtaining [place] insurance soon and is wondering if there is another option to order the study.   She is still smoking cigarettes and is interested in the nicotine patches. Reports cardiology prescribed these for her but she has not started because it is not the correct level for the amount she smokes. Notes a recent increase in use due to increase in stress with work.  She has been experiencing a hand and foot rash with itching. Rash occurs on the dorsal side of hands and the plantar surface  of her feet. Denies any new cleaning or beauty products.   06/25/22 Patient seen on return follow-up note since last visit she had a sleep study which was normal.  She still smoking 5 cigarettes daily.  On arrival blood pressure elevated 143/85 it is rechecked and accurate.  She states at home she runs the same numbers.  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  Patient would like a letter to her employer to see if she can be switched to a day job because of the agitation and its effect on her health.  She has no other real complaints at this visit.  She did score very high on her GAD-7 and PHQ-9 and is not on mental health medications at this time.  10/30/22 Patient seen today for blood pressure follow-up blood pressure on arrival is good 138/78.  She is taking her current medications as prescribed. 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 No pertinent surgical history. Social History   Tobacco Use   Smoking status: Every Day    Packs/day: 0.25    Years: 9.00    Additional pack years: 0.00    Total pack years: 2.25    Types:  Cigarettes   Smokeless tobacco: Never  Vaping Use   Vaping Use: Never used  Substance Use Topics   Alcohol use: No   Drug use: Yes    Types: Marijuana    Comment: twice a week    Family History  Problem Relation Age of Onset   Heart attack Mother    Hypertension Mother    Heart failure Mother        transplant   Allergies  Allergen Reactions   Ritalin [Methylphenidate Hcl] Rash      Review of Systems  Constitutional:  Negative for chills and fever.  HENT:  Negative for congestion, hearing loss and sore throat.        Dental pain  Eyes: Negative.   Respiratory:  Negative for cough and sputum production.   Cardiovascular:  Negative for chest pain and leg swelling.  Gastrointestinal: Negative.   Genitourinary: Negative.   Musculoskeletal: Negative.   Skin:  Negative for itching and rash.  Neurological: Negative.   Endo/Heme/Allergies: Negative.   Psychiatric/Behavioral:  Negative for depression and suicidal ideas. The patient is not nervous/anxious and does not have insomnia.       Objective:     BP 138/78   Pulse 65   Ht 5\' 4"  (1.626 m)   Wt 210 lb (95.3 kg)   SpO2 100%   BMI 36.05 kg/m     Physical Exam Vitals reviewed.  Constitutional:      Appearance: Normal appearance. She is well-developed. She is obese. She is not diaphoretic.  HENT:     Head: Normocephalic and atraumatic.     Right Ear: Tympanic membrane, ear canal and external ear normal.     Left Ear: Tympanic membrane, ear canal and external ear normal.     Nose: No nasal deformity, septal deviation, mucosal edema or rhinorrhea.     Right Sinus: No maxillary sinus tenderness or frontal sinus tenderness.     Left Sinus: No maxillary sinus tenderness or frontal sinus tenderness.     Mouth/Throat:     Mouth: Mucous membranes are moist.     Pharynx: Oropharynx is clear. No oropharyngeal exudate.     Comments: Fractured molars bilateral lower jaw with gingivitis no abscess Eyes:     General: No  scleral icterus.    Conjunctiva/sclera: Conjunctivae normal.     Pupils: Pupils are equal, round, and reactive to light.  Neck:     Thyroid: No thyromegaly.     Vascular: No carotid bruit or JVD.     Trachea: Trachea normal. No tracheal tenderness or tracheal deviation.  Cardiovascular:     Rate and Rhythm: Normal rate and regular rhythm.     Chest Wall: PMI is not displaced.     Pulses: Normal pulses. No decreased pulses.     Heart sounds: Normal heart sounds, S1 normal and S2 normal. Heart sounds not distant. No murmur heard.  No systolic murmur is present.     No diastolic murmur is present.     No friction rub. No gallop. No S3 or S4 sounds.  Pulmonary:     Effort: Pulmonary effort is normal. No tachypnea, accessory muscle usage or respiratory distress.     Breath sounds: Normal breath sounds. No stridor. No decreased breath sounds, wheezing, rhonchi or rales.  Chest:     Chest wall: No tenderness.  Abdominal:     General: Bowel sounds are normal. There is no distension.     Palpations: Abdomen is soft. Abdomen is not rigid.     Tenderness: There is no abdominal tenderness. There is no guarding or rebound.  Musculoskeletal:        General: Normal range of motion.     Cervical back: Normal range of motion and neck supple. No edema, erythema or rigidity. No muscular tenderness. Normal range of motion.  Lymphadenopathy:     Head:     Right side of head: No submental or submandibular adenopathy.     Left side of head: No submental or submandibular adenopathy.     Cervical: No cervical adenopathy.  Skin:    General: Skin is warm and dry.     Capillary Refill: Capillary refill takes less than 2 seconds.     Coloration: Skin is not pale.     Findings: No rash.     Nails: There is no clubbing.     Comments: Multiple scattered erythematous papules bilateral dorsal surface of hand Multiple small areas of hyperpigmentation bottom of bilateral feet  Fungal excoriations present both  feet   Neurological:     Mental Status: She is alert and oriented to person, place, and time.     Sensory: No sensory deficit.  Psychiatric:        Attention and Perception: Attention and perception normal.        Mood and Affect: Mood is anxious. Affect is tearful.        Speech: Speech normal.        Behavior: Behavior normal.        Thought Content: Thought content is not paranoid or delusional. Thought content includes suicidal ideation. Thought content does not include homicidal ideation. Thought content does not include homicidal or suicidal plan.        Cognition and Memory: Cognition and memory normal.        Judgment: Judgment normal.   RECENT CV STUDIES Echocardiogram 11/08/2021   1. Left ventricular ejection fraction, by estimation, is 55 to 60%. The  left ventricle has normal function. The left ventricle has no regional  wall motion abnormalities. Left ventricular diastolic parameters were  normal. The average left ventricular  global longitudinal strain is -15.8 %. The global longitudinal strain is  abnormal.   2. Right ventricular systolic function is normal. The right ventricular  size is normal.   3. Left atrial size was mildly dilated.   4. The mitral valve is normal in structure. Trivial mitral valve  regurgitation. No evidence of mitral stenosis.   5. The aortic valve is tricuspid. Aortic valve regurgitation is not  visualized. No aortic stenosis is present.   6. The inferior vena cava is normal in size with greater than 50%  respiratory variability, suggesting right atrial pressure of 3 mmHg.     20 Day Event Monitor 12/07/2021   Quality: Fair.  Baseline artifact. Predominant rhythm: sinus rhythm Average heart rate: 78 bpm Max heart rate: 131 bpm  Min heart rate: 52 bpm Pauses >2.5 seconds: none Occasional PVCs   No results found for any visits on 10/31/22.     The ASCVD Risk score (Arnett DK, et al., 2019) failed to calculate for the following  reasons:   The 2019 ASCVD risk score is only valid for ages 66 to 3    Assessment & Plan:   Problem List Items Addressed This Visit       Cardiovascular and Mediastinum   Essential hypertension - Primary    Under control no change        Other   Tobacco dependency       Current smoking consumption amount: 1PPD Dicsussion on advise to quit smoking and smoking impacts: Cardiovascular impacts  Patient's willingness to quit: Wants to quit  Methods to quit smoking discussed: Nicotine replacement  Medication management of smoking session drugs discussed: Nicotine replacement Nicotine patch 21 mg/24 hr sent to pharmacy today, advised patient to use these instead of the 14mg /24hr ones previously sent by cardiology  Nicotine lozenges sent to pharmacy as well   Resources provided:  AVS   Setting quit date not established  Follow-up arranged four months  Time spent counseling the patient: 5 minutes       Anxiety and depression    Improved with therapy with use of Lexapro      Chronic dental pain    Failed standard therapy will give short trial of tramadol and encouraged dental appointment as soon as she gets Medicaid      Relevant Medications   traMADol (ULTRAM) 50 MG tablet  spent on history /physical and patient education  Return in about 2 months (around 12/31/2022) for htn.    Shan Levans, MD

## 2022-10-31 NOTE — Assessment & Plan Note (Signed)
    Current smoking consumption amount: 1PPD Dicsussion on advise to quit smoking and smoking impacts: Cardiovascular impacts  Patient's willingness to quit: Wants to quit  Methods to quit smoking discussed: Nicotine replacement  Medication management of smoking session drugs discussed: Nicotine replacement Nicotine patch 21 mg/24 hr sent to pharmacy today, advised patient to use these instead of the 14mg/24hr ones previously sent by cardiology  Nicotine lozenges sent to pharmacy as well   Resources provided:  AVS   Setting quit date not established  Follow-up arranged four months  Time spent counseling the patient: 5 minutes  

## 2022-10-31 NOTE — Assessment & Plan Note (Signed)
Under control no change

## 2022-10-31 NOTE — Patient Instructions (Addendum)
Medicaid office located 4th floor [address]   Trial of tramadol 1 every 8 hours as needed sent to pharmacy downstairs  Please apply for Medicaid as above  No change in medications  Reduce tobacco intake  Return to Dr. Delford Field 2 months

## 2022-10-31 NOTE — Assessment & Plan Note (Addendum)
Improved with therapy with use of Lexapro

## 2022-10-31 NOTE — Progress Notes (Signed)
Oral pain, otc medication not working.

## 2022-11-01 ENCOUNTER — Other Ambulatory Visit: Payer: Self-pay

## 2022-11-21 ENCOUNTER — Ambulatory Visit (HOSPITAL_BASED_OUTPATIENT_CLINIC_OR_DEPARTMENT_OTHER): Payer: Self-pay | Admitting: Cardiovascular Disease

## 2022-12-29 NOTE — Progress Notes (Deleted)
Established Patient Office Visit  Subjective   Patient ID: Sarah Small, female    DOB: [birth year]  Age: 32 y.o. MRN: [id number]  No chief complaint on file.   02/2022 Ms [place] is a 32 year old female with history of hypertension, obstructive sleep apnea and tobacco use. She presents today with her wife for follow up.   She was referred to cardiology for hypertension and intermittent palpations. They performed an echocardiogram and a cardiac monitor trial- results below. They also suggested she increase her daily carvedilol to 25 mg twice a day. She has not done this yet and is still taking 12.5 mg BID. Her blood pressure today is 146/95. She does take her blood pressure at home and reports her numbers run around this as well. Other blood pressure medications include: amlodipine 20 mg daily, aldactone 25 mg daily and valsartan 320 mg daily. She does still feel intermittent palpitations, most recently last night, during activity with her wife. She reports that her wife calming her down and instructing her to breathe alleviated the symptoms.   She was evaluatated by ENT for witnessed apneas, daytime fatigue and elongated uvula. They ordered a sleep study through the company [employer]. Unfortunately, patient did not sleep long enough for adequate data and will need to repeat this. However, she can not financially afford the fee at this time. Her wife reports they are obtaining [place] insurance soon and is wondering if there is another option to order the study.   She is still smoking cigarettes and is interested in the nicotine patches. Reports cardiology prescribed these for her but she has not started because it is not the correct level for the amount she smokes. Notes a recent increase in use due to increase in stress with work.  She has been experiencing a hand and foot rash with itching. Rash occurs on the dorsal side of hands and the plantar surface of her feet. Denies any new cleaning or  beauty products.   06/25/22 Patient seen on return follow-up note since last visit she had a sleep study which was normal.  She still smoking 5 cigarettes daily.  On arrival blood pressure elevated 143/85 it is rechecked and accurate.  She states at home she runs the same numbers.  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  Patient would like a letter to her employer to see if she can be switched to a day job because of the agitation and its effect on her health.  She has no other real complaints at this visit.  She did score very high on her GAD-7 and PHQ-9 and is not on mental health medications at this time.  10/30/22 Patient seen today for blood pressure follow-up blood pressure on arrival is good 138/78.  She is taking her current medications as prescribed. Patient with 2 fractured molars and quite a bit of pain She does not have Medicaid she needs to get this applied  7/16   Patient Active Problem List   Diagnosis Date Noted  . Chronic dental pain 10/31/2022  . Anxiety and depression 06/25/2022  . Tinea pedis 01/23/2022  . Atopic dermatitis of both hands 01/23/2022  . 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 Palpitations 07/23/2021  . Neck pain, bilateral 07/23/2021  . 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 Chronic abdominal pain 06/29/2020  . Gastroesophageal reflux  disease without esophagitis 06/29/2020  . Menorrhagia with regular cycle 06/29/2020  . BMI 33.0-33.9,adult 06/29/2020  . Tobacco dependency 02/24/2020  . Essential hypertension 01/17/2020   Past Medical History:  Diagnosis Date  . 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 Hypertension    No past surgical history on file. Social History   Tobacco Use  . Smoking status: Every Day    Current packs/day: 0.25    Average packs/day: 0.3 packs/day for 9.0 years (2.3 ttl pk-yrs)    Types: Cigarettes  . Smokeless  tobacco: Never  Vaping Use  . Vaping status: Never Used  Substance Use Topics  . Alcohol use: No  . Drug use: Yes    Types: Marijuana    Comment: twice a week    Family History  Problem Relation Age of Onset  . Heart attack Mother   . Hypertension Mother   . Heart failure Mother        transplant   Allergies  Allergen Reactions  . Ritalin [Methylphenidate Hcl] Rash      Review of Systems  Constitutional:  Negative for chills and fever.  HENT:  Negative for congestion, hearing loss and sore throat.        Dental pain  Eyes: Negative.   Respiratory:  Negative for cough and sputum production.   Cardiovascular:  Negative for chest pain and leg swelling.  Gastrointestinal: Negative.   Genitourinary: Negative.   Musculoskeletal: Negative.   Skin:  Negative for itching and rash.  Neurological: Negative.   Endo/Heme/Allergies: Negative.   Psychiatric/Behavioral:  Negative for depression and suicidal ideas. The patient is not nervous/anxious and does not have insomnia.       Objective:     There were no vitals taken for this visit.    Physical Exam Vitals reviewed.  Constitutional:      Appearance: Normal appearance. She is well-developed. She is obese. She is not diaphoretic.  HENT:     Head: Normocephalic and atraumatic.     Right Ear: Tympanic membrane, ear canal and external ear normal.     Left Ear: Tympanic membrane, ear canal and external ear normal.     Nose: No nasal deformity, septal deviation, mucosal edema or rhinorrhea.     Right Sinus: No maxillary sinus tenderness or frontal sinus tenderness.     Left Sinus: No maxillary sinus tenderness or frontal sinus tenderness.     Mouth/Throat:     Mouth: Mucous membranes are moist.     Pharynx: Oropharynx is clear. No oropharyngeal exudate.     Comments: Fractured molars bilateral lower jaw with gingivitis no abscess Eyes:     General: No scleral icterus.    Conjunctiva/sclera: Conjunctivae normal.      Pupils: Pupils are equal, round, and reactive to light.  Neck:     Thyroid: No thyromegaly.     Vascular: No carotid bruit or JVD.     Trachea: Trachea normal. No tracheal tenderness or tracheal deviation.  Cardiovascular:     Rate and Rhythm: Normal rate and regular rhythm.     Chest Wall: PMI is not displaced.     Pulses: Normal pulses. No decreased pulses.     Heart sounds: Normal heart sounds, S1 normal and S2 normal. Heart sounds not distant. No murmur heard.    No systolic murmur is present.     No diastolic murmur is present.     No friction rub. No gallop. No S3 or S4 sounds.  Pulmonary:  Effort: Pulmonary effort is normal. No tachypnea, accessory muscle usage or respiratory distress.     Breath sounds: Normal breath sounds. No stridor. No decreased breath sounds, wheezing, rhonchi or rales.  Chest:     Chest wall: No tenderness.  Abdominal:     General: Bowel sounds are normal. There is no distension.     Palpations: Abdomen is soft. Abdomen is not rigid.     Tenderness: There is no abdominal tenderness. There is no guarding or rebound.  Musculoskeletal:        General: Normal range of motion.     Cervical back: Normal range of motion and neck supple. No edema, erythema or rigidity. No muscular tenderness. Normal range of motion.  Lymphadenopathy:     Head:     Right side of head: No submental or submandibular adenopathy.     Left side of head: No submental or submandibular adenopathy.     Cervical: No cervical adenopathy.  Skin:    General: Skin is warm and dry.     Capillary Refill: Capillary refill takes less than 2 seconds.     Coloration: Skin is not pale.     Findings: No rash.     Nails: There is no clubbing.     Comments: Multiple scattered erythematous papules bilateral dorsal surface of hand Multiple small areas of hyperpigmentation bottom of bilateral feet  Fungal excoriations present both feet   Neurological:     Mental Status: She is alert and oriented  to person, place, and time.     Sensory: No sensory deficit.  Psychiatric:        Attention and Perception: Attention and perception normal.        Mood and Affect: Mood is anxious. Affect is tearful.        Speech: Speech normal.        Behavior: Behavior normal.        Thought Content: Thought content is not paranoid or delusional. Thought content includes suicidal ideation. Thought content does not include homicidal ideation. Thought content does not include homicidal or suicidal plan.        Cognition and Memory: Cognition and memory normal.        Judgment: Judgment normal.  RECENT CV STUDIES Echocardiogram 11/08/2021   1. Left ventricular ejection fraction, by estimation, is 55 to 60%. The  left ventricle has normal function. The left ventricle has no regional  wall motion abnormalities. Left ventricular diastolic parameters were  normal. The average left ventricular  global longitudinal strain is -15.8 %. The global longitudinal strain is  abnormal.   2. Right ventricular systolic function is normal. The right ventricular  size is normal.   3. Left atrial size was mildly dilated.   4. The mitral valve is normal in structure. Trivial mitral valve  regurgitation. No evidence of mitral stenosis.   5. The aortic valve is tricuspid. Aortic valve regurgitation is not  visualized. No aortic stenosis is present.   6. The inferior vena cava is normal in size with greater than 50%  respiratory variability, suggesting right atrial pressure of 3 mmHg.     20 Day Event Monitor 12/07/2021   Quality: Fair.  Baseline artifact. Predominant rhythm: sinus rhythm Average heart rate: 78 bpm Max heart rate: 131 bpm Min heart rate: 52 bpm Pauses >2.5 seconds: none Occasional PVCs   No results found for any visits on 12/31/22.     The ASCVD Risk score (Arnett DK, et al., 2019) failed  to calculate for the following reasons:   The 2019 ASCVD risk score is only valid for ages 58 to 21     Assessment & Plan:   Problem List Items Addressed This Visit   None  spent on history /physical and patient education  No follow-ups on file.    Shan Levans, MD

## 2022-12-31 ENCOUNTER — Ambulatory Visit: Payer: Self-pay | Admitting: Critical Care Medicine

## 2023-01-01 ENCOUNTER — Encounter (HOSPITAL_BASED_OUTPATIENT_CLINIC_OR_DEPARTMENT_OTHER): Payer: Self-pay | Admitting: Cardiovascular Disease

## 2023-01-07 ENCOUNTER — Emergency Department (HOSPITAL_BASED_OUTPATIENT_CLINIC_OR_DEPARTMENT_OTHER)
Admission: EM | Admit: 2023-01-07 | Discharge: 2023-01-07 | Disposition: A | Discharge status: Home / Self Care | Payer: Self-pay | Attending: Emergency Medicine | Admitting: Emergency Medicine

## 2023-01-07 ENCOUNTER — Encounter (HOSPITAL_BASED_OUTPATIENT_CLINIC_OR_DEPARTMENT_OTHER): Payer: Self-pay

## 2023-01-07 DIAGNOSIS — B029 Zoster without complications: Secondary | ICD-10-CM | POA: Insufficient documentation

## 2023-01-07 MED ORDER — VALACYCLOVIR HCL 500 MG PO TABS
1000.0000 mg | ORAL_TABLET | Freq: Once | ORAL | Status: AC
  Administered 2023-01-07: 1000 mg via ORAL
  Filled 2023-01-07: qty 2

## 2023-01-07 MED ORDER — HYDROCODONE-ACETAMINOPHEN 5-325 MG PO TABS: 1.0000 | ORAL_TABLET | Freq: Four times a day (QID) | ORAL | 0 refills | Status: DC | PRN

## 2023-01-07 MED ORDER — VALACYCLOVIR HCL 1 G PO TABS: 1000.0000 mg | ORAL_TABLET | Freq: Three times a day (TID) | ORAL | 0 refills | Status: DC

## 2023-01-07 NOTE — ED Provider Notes (Signed)
[place] EMERGENCY DEPARTMENT AT [facility]  Provider Note  CSN: [id number] Arrival date & time: 01/07/23 5621  History Chief Complaint  Patient presents with   Insect Bite    Sarah Small is a 32 y.o. female reports onset of painful, itchy rash on her L buttock yesterday. She thinks something may have bitten her, but did not actually see an insect. Pain is worse with sitting.    Home Medications Prior to Admission medications   Medication Sig Start Date End Date Taking? Authorizing Provider  HYDROcodone-acetaminophen (NORCO/VICODIN) 5-325 MG tablet Take 1 tablet by mouth every 6 (six) hours as needed for severe pain. 01/07/23  Yes Pollyann Savoy, MD  valACYclovir (VALTREX) 1000 MG tablet Take 1 tablet (1,000 mg total) by mouth 3 (three) times daily. 01/07/23  Yes Pollyann Savoy, MD  albuterol (VENTOLIN HFA) 108 (90 Base) MCG/ACT inhaler Inhale 1-2 puffs into the lungs every 6 (six) hours as needed for wheezing or shortness of breath. 07/22/21   Dahlia Byes A, NP  amLODipine (NORVASC) 10 MG tablet TAKE 1 TABLET (10 MG TOTAL) BY MOUTH DAILY. 06/25/22 06/25/23  Storm Frisk, MD  carvedilol (COREG) 25 MG tablet Take 1 tablet (25 mg total) by mouth 2 (two) times daily. 06/25/22 12/22/22  Storm Frisk, MD  clobetasol cream (TEMOVATE) 0.05 % Apply 1 Application topically 2 (two) times daily. To hands 06/25/22   Storm Frisk, MD  escitalopram (LEXAPRO) 10 MG tablet Take 1 tablet (10 mg total) by mouth daily. 06/25/22   Storm Frisk, MD  nicotine (NICODERM CQ - DOSED IN MG/24 HOURS) 21 mg/24hr patch Place 1 patch (21 mg total) onto the skin daily. 01/23/22   Storm Frisk, MD  nicotine polacrilex (NICORETTE MINI) 4 MG lozenge Use three times a day to quit smoking 01/23/22   Storm Frisk, MD  pantoprazole (PROTONIX) 40 MG tablet Take 1 tablet (40 mg total) by mouth daily. 06/25/22   Storm Frisk, MD  spironolactone (ALDACTONE) 50 MG tablet Take 1 tablet (50 mg  total) by mouth daily. 06/25/22   Storm Frisk, MD  valsartan (DIOVAN) 320 MG tablet Take 1 tablet (320 mg total) by mouth daily. 06/25/22   Storm Frisk, MD     Allergies    Ritalin [methylphenidate hcl]   Review of Systems   Review of Systems Please see HPI for pertinent positives and negatives  Physical Exam BP (!) 173/112   Pulse 71   Temp 98.3 F (36.8 C) (Oral)   Resp 18   Ht 5\' 3"  (1.6 m)   Wt 94.3 kg   LMP 12/29/2022 (Exact Date)   SpO2 100%   BMI 36.85 kg/m   Physical Exam Vitals and nursing note reviewed. Exam conducted with a chaperone present.  HENT:     Head: Normocephalic.     Nose: Nose normal.  Eyes:     Extraocular Movements: Extraocular movements intact.  Pulmonary:     Effort: Pulmonary effort is normal.  Musculoskeletal:        General: Normal range of motion.     Cervical back: Neck supple.  Skin:    Findings: No rash (multiple cutaneous nodules in a strip along her L buttock).  Neurological:     Mental Status: She is alert and oriented to person, place, and time.  Psychiatric:        Mood and Affect: Mood normal.     ED Results / Procedures /  Treatments   EKG None  Procedures Procedures  Medications Ordered in the ED Medications  valACYclovir (VALTREX) tablet 1,000 mg (has no administration in time range)    Initial Impression and Plan  Patient here with painful, itching rash on L buttock concerning for developing zoster. No signs of cellulitis. Will treat with Valtrex, norco, benadryl. PCP follow up, RTED for any other concerns.    ED Course       MDM Rules/Calculators/A&P Medical Decision Making Problems Addressed: Herpes zoster without complication: acute illness or injury  Risk Prescription drug management.     Final Clinical Impression(s) / ED Diagnoses Final diagnoses:  Herpes zoster without complication    Rx / DC Orders ED Discharge Orders          Ordered    valACYclovir (VALTREX) 1000 MG  tablet  3 times daily        01/07/23 0142    HYDROcodone-acetaminophen (NORCO/VICODIN) 5-325 MG tablet  Every 6 hours PRN        01/07/23 0142             Pollyann Savoy, MD 01/07/23 [phone]

## 2023-01-07 NOTE — ED Triage Notes (Addendum)
Insect bite on left buttocks, noticed it yesterday because it started itching. Raised swollen areas noted on left buttocks No drainage

## 2023-01-07 NOTE — ED Notes (Signed)
Raised area on left buttocks, no drainage.  Per pt itchy and painful Appears like shingles

## 2023-02-03 ENCOUNTER — Ambulatory Visit: Payer: Self-pay | Admitting: Physician Assistant

## 2023-03-28 ENCOUNTER — Encounter (HOSPITAL_BASED_OUTPATIENT_CLINIC_OR_DEPARTMENT_OTHER): Payer: Self-pay | Admitting: Emergency Medicine

## 2023-03-28 DIAGNOSIS — K0889 Other specified disorders of teeth and supporting structures: Secondary | ICD-10-CM | POA: Insufficient documentation

## 2023-03-28 NOTE — ED Triage Notes (Signed)
Dental pain Right lower molar. Seen by specialist need surgery, unable to schedule until jan 2025  Pain intolerable now. Not relieved with otc pain meds or tramadol.

## 2023-03-29 ENCOUNTER — Emergency Department (HOSPITAL_BASED_OUTPATIENT_CLINIC_OR_DEPARTMENT_OTHER)
Admission: EM | Admit: 2023-03-29 | Discharge: 2023-03-29 | Disposition: A | Discharge status: Home / Self Care | Payer: Self-pay | Attending: Emergency Medicine | Admitting: Emergency Medicine

## 2023-03-29 DIAGNOSIS — K0889 Other specified disorders of teeth and supporting structures: Secondary | ICD-10-CM

## 2023-03-29 MED ORDER — ACETAMINOPHEN 500 MG PO TABS
1000.0000 mg | ORAL_TABLET | Freq: Once | ORAL | Status: AC
  Administered 2023-03-29: 1000 mg via ORAL
  Filled 2023-03-29: qty 2

## 2023-03-29 MED ORDER — KETOROLAC TROMETHAMINE 15 MG/ML IJ SOLN
15.0000 mg | Freq: Once | INTRAMUSCULAR | Status: AC
  Administered 2023-03-29: 15 mg via INTRAMUSCULAR
  Filled 2023-03-29: qty 1

## 2023-03-29 MED ORDER — OXYCODONE HCL 5 MG PO TABS
5.0000 mg | ORAL_TABLET | Freq: Once | ORAL | Status: AC
  Administered 2023-03-29: 5 mg via ORAL
  Filled 2023-03-29: qty 1

## 2023-03-29 MED ORDER — AMOXICILLIN 500 MG PO CAPS: 1000.0000 mg | ORAL_CAPSULE | Freq: Two times a day (BID) | ORAL | 0 refills | Status: DC

## 2023-03-29 MED ORDER — AMOXICILLIN 500 MG PO CAPS
1000.0000 mg | ORAL_CAPSULE | Freq: Once | ORAL | Status: AC
  Administered 2023-03-29: 1000 mg via ORAL
  Filled 2023-03-29: qty 2

## 2023-03-29 NOTE — Discharge Instructions (Addendum)
Take 4 over the counter ibuprofen tablets 3 times a day or 2 over-the-counter naproxen tablets twice a day for pain. Also take tylenol 1000mg (2 extra strength) four times a day.   Return for worsening swelling difficulty swallowing, fever.

## 2023-03-29 NOTE — ED Provider Notes (Signed)
[place] EMERGENCY DEPARTMENT AT [facility] Provider Note   CSN: [id number] Arrival date & time: 03/28/23  2109     History  Chief Complaint  Patient presents with   Dental Pain    Sarah Small is a 32 y.o. female.  32 yo F with a chief complaints of right lower dental pain.  This has been going on for some time she has recently seen a dentist and is scheduled to see an oral surgeon in a few months time.  She felt the pain is got worse over the past few days.  Is like she has had some swelling adjacent to the area.  No fevers or difficulty swallowing.   Dental Pain      Home Medications Prior to Admission medications   Medication Sig Start Date End Date Taking? Authorizing Provider  amoxicillin (AMOXIL) 500 MG capsule Take 2 capsules (1,000 mg total) by mouth 2 (two) times daily. 03/29/23  Yes Melene Plan, DO  albuterol (VENTOLIN HFA) 108 (90 Base) MCG/ACT inhaler Inhale 1-2 puffs into the lungs every 6 (six) hours as needed for wheezing or shortness of breath. 07/22/21   Dahlia Byes A, NP  amLODipine (NORVASC) 10 MG tablet TAKE 1 TABLET (10 MG TOTAL) BY MOUTH DAILY. 06/25/22 06/25/23  Storm Frisk, MD  carvedilol (COREG) 25 MG tablet Take 1 tablet (25 mg total) by mouth 2 (two) times daily. 06/25/22 12/22/22  Storm Frisk, MD  clobetasol cream (TEMOVATE) 0.05 % Apply 1 Application topically 2 (two) times daily. To hands 06/25/22   Storm Frisk, MD  escitalopram (LEXAPRO) 10 MG tablet Take 1 tablet (10 mg total) by mouth daily. 06/25/22   Storm Frisk, MD  HYDROcodone-acetaminophen (NORCO/VICODIN) 5-325 MG tablet Take 1 tablet by mouth every 6 (six) hours as needed for severe pain. 01/07/23   Pollyann Savoy, MD  nicotine (NICODERM CQ - DOSED IN MG/24 HOURS) 21 mg/24hr patch Place 1 patch (21 mg total) onto the skin daily. 01/23/22   Storm Frisk, MD  nicotine polacrilex (NICORETTE MINI) 4 MG lozenge Use three times a day to quit smoking 01/23/22   Storm Frisk, MD  pantoprazole (PROTONIX) 40 MG tablet Take 1 tablet (40 mg total) by mouth daily. 06/25/22   Storm Frisk, MD  spironolactone (ALDACTONE) 50 MG tablet Take 1 tablet (50 mg total) by mouth daily. 06/25/22   Storm Frisk, MD  valACYclovir (VALTREX) 1000 MG tablet Take 1 tablet (1,000 mg total) by mouth 3 (three) times daily. 01/07/23   Pollyann Savoy, MD  valsartan (DIOVAN) 320 MG tablet Take 1 tablet (320 mg total) by mouth daily. 06/25/22   Storm Frisk, MD      Allergies    Ritalin [methylphenidate hcl]    Review of Systems   Review of Systems  Physical Exam Updated Vital Signs BP 139/89   Pulse 68   Temp 98.7 F (37.1 C)   Resp 18   LMP 02/24/2023 Comment: denies intercourse  SpO2 99%  Physical Exam Vitals and nursing note reviewed.  Constitutional:      General: She is not in acute distress.    Appearance: She is well-developed. She is not diaphoretic.  HENT:     Head: Normocephalic and atraumatic.     Mouth/Throat:     Comments: Patient has a erosion through the right lower first molar.  I do not appreciate any obvious adjacent edema.  There is no trismus no sublingual  swelling tolerating secretions without difficulty.  Able to range the head without discomfort. Eyes:     Pupils: Pupils are equal, round, and reactive to light.  Cardiovascular:     Rate and Rhythm: Normal rate and regular rhythm.     Heart sounds: No murmur heard.    No friction rub. No gallop.  Pulmonary:     Effort: Pulmonary effort is normal.     Breath sounds: No wheezing or rales.  Abdominal:     General: There is no distension.     Palpations: Abdomen is soft.     Tenderness: There is no abdominal tenderness.  Musculoskeletal:        General: No tenderness.     Cervical back: Normal range of motion and neck supple.  Skin:    General: Skin is warm and dry.  Neurological:     Mental Status: She is alert and oriented to person, place, and time.  Psychiatric:         Behavior: Behavior normal.     ED Results / Procedures / Treatments   Labs (all labs ordered are listed, but only abnormal results are displayed) Labs Reviewed - No data to display  EKG None  Radiology No results found.  Procedures Procedures    Medications Ordered in ED Medications  acetaminophen (TYLENOL) tablet 1,000 mg (has no administration in time range)  ketorolac (TORADOL) 15 MG/ML injection 15 mg (has no administration in time range)  oxyCODONE (Oxy IR/ROXICODONE) immediate release tablet 5 mg (has no administration in time range)  amoxicillin (AMOXIL) capsule 1,000 mg (has no administration in time range)    ED Course/ Medical Decision Making/ A&P                                 Medical Decision Making Risk OTC drugs. Prescription drug management.   32 yo F with a chief complaint of right lower dental pain.  This been going on for some time has recently seen a dentist and is [place] to see an oral surgeon in about 3 months.  Had worsening dental pain over the past few days.  No obvious signs of abscess or infection on my exam.  Will start on antibiotics.  Dentistry and PCP follow-up.  2:21 AM:  I have discussed the diagnosis/risks/treatment options with the patient.  Evaluation and diagnostic testing in the emergency department does not suggest an emergent condition requiring admission or immediate intervention beyond what has been performed at this time.  They will follow up with PCP, Dentistry. We also discussed returning to the ED immediately if new or worsening sx occur. We discussed the sx which are most concerning (e.g., sudden worsening pain, fever, inability to tolerate by mouth) that necessitate immediate return. Medications administered to the patient during their visit and any new prescriptions provided to the patient are listed below.  Medications given during this visit Medications  acetaminophen (TYLENOL) tablet 1,000 mg (has no administration in time  range)  ketorolac (TORADOL) 15 MG/ML injection 15 mg (has no administration in time range)  oxyCODONE (Oxy IR/ROXICODONE) immediate release tablet 5 mg (has no administration in time range)  amoxicillin (AMOXIL) capsule 1,000 mg (has no administration in time range)     The patient appears reasonably screen and/or stabilized for discharge and I doubt any other medical condition or other [facility] requiring further screening, evaluation, or treatment in the ED at this time prior to  discharge.          Final Clinical Impression(s) / ED Diagnoses Final diagnoses:  Pain, dental    Rx / DC Orders ED Discharge Orders          Ordered    amoxicillin (AMOXIL) 500 MG capsule  2 times daily        03/29/23 0206              Melene Plan, DO 03/29/23 0221

## 2023-07-17 ENCOUNTER — Ambulatory Visit: Payer: Self-pay | Admitting: Physician Assistant

## 2023-08-19 ENCOUNTER — Ambulatory Visit (HOSPITAL_COMMUNITY)
Admission: EM | Admit: 2023-08-19 | Discharge: 2023-08-19 | Disposition: A | Discharge status: Home / Self Care | Payer: Self-pay | Attending: Family Medicine | Admitting: Family Medicine

## 2023-08-19 ENCOUNTER — Other Ambulatory Visit: Payer: Self-pay | Admitting: Critical Care Medicine

## 2023-08-19 ENCOUNTER — Encounter (HOSPITAL_COMMUNITY): Payer: Self-pay | Admitting: *Deleted

## 2023-08-19 DIAGNOSIS — R519 Headache, unspecified: Secondary | ICD-10-CM

## 2023-08-19 DIAGNOSIS — R42 Dizziness and giddiness: Secondary | ICD-10-CM

## 2023-08-19 DIAGNOSIS — I10 Essential (primary) hypertension: Secondary | ICD-10-CM

## 2023-08-19 LAB — POCT URINALYSIS DIP (MANUAL ENTRY)
Bilirubin, UA: NEGATIVE
Glucose, UA: NEGATIVE mg/dL
Leukocytes, UA: NEGATIVE
Nitrite, UA: NEGATIVE
Protein Ur, POC: NEGATIVE mg/dL
Spec Grav, UA: 1.025 (ref 1.010–1.025)
Urobilinogen, UA: 0.2 U/dL
pH, UA: 6.5 (ref 5.0–8.0)

## 2023-08-19 LAB — POCT FASTING CBG KUC MANUAL ENTRY: POCT Glucose (KUC): 96 mg/dL (ref 70–99)

## 2023-08-19 LAB — POCT URINE PREGNANCY: Preg Test, Ur: NEGATIVE

## 2023-08-19 MED ORDER — CLONIDINE HCL 0.1 MG PO TABS
0.2000 mg | ORAL_TABLET | Freq: Once | ORAL | Status: AC
  Administered 2023-08-19: 0.2 mg via ORAL

## 2023-08-19 MED ORDER — AMLODIPINE BESYLATE 10 MG PO TABS: 10.0000 mg | ORAL_TABLET | Freq: Every day | ORAL | 1 refills | Status: DC

## 2023-08-19 MED ORDER — CLONIDINE HCL 0.1 MG PO TABS
ORAL_TABLET | ORAL | Status: AC
  Filled 2023-08-19: qty 2

## 2023-08-19 NOTE — Discharge Instructions (Signed)
 Your blood pressure was noted to be elevated during your visit today. If you are currently taking medication for high blood pressure, please ensure you are taking this as directed. If you do not have a history of high blood pressure and your blood pressure remains persistently elevated, you may need to begin taking a medication at some point. You may return here within the next few days to recheck if unable to see your primary care provider or if you do not have a one.  BP (!) 154/92 (BP Location: Left Arm)   Pulse (!) 52   Temp 98.5 F (36.9 C) (Oral)   Resp 18   LMP 08/15/2023 (Exact Date)   SpO2 98%   BP Readings from Last 3 Encounters:  08/19/23 (!) 154/92  03/29/23 139/89  01/07/23 (!) 173/112

## 2023-08-19 NOTE — ED Triage Notes (Signed)
 Pt states she she is fatigued, weak, dizzy, and has a slight headache X 5 days.   She was out of her meds and just recently had them filled but she states it will take a couple days to get them back. She has a PCP appt next week since she hasn't seen them in awhile. She has been off her meds X 1 year.

## 2023-08-20 ENCOUNTER — Emergency Department (HOSPITAL_COMMUNITY)
Admission: EM | Admit: 2023-08-20 | Discharge: 2023-08-21 | Disposition: A | Discharge status: Home / Self Care | Payer: Self-pay | Attending: Student | Admitting: Student

## 2023-08-20 ENCOUNTER — Other Ambulatory Visit: Payer: Self-pay | Admitting: Critical Care Medicine

## 2023-08-20 ENCOUNTER — Other Ambulatory Visit: Payer: Self-pay

## 2023-08-20 ENCOUNTER — Ambulatory Visit: Payer: Self-pay | Admitting: Critical Care Medicine

## 2023-08-20 DIAGNOSIS — R519 Headache, unspecified: Secondary | ICD-10-CM | POA: Insufficient documentation

## 2023-08-20 DIAGNOSIS — I10 Essential (primary) hypertension: Secondary | ICD-10-CM | POA: Insufficient documentation

## 2023-08-20 DIAGNOSIS — Z79899 Other long term (current) drug therapy: Secondary | ICD-10-CM | POA: Insufficient documentation

## 2023-08-20 DIAGNOSIS — R42 Dizziness and giddiness: Secondary | ICD-10-CM | POA: Insufficient documentation

## 2023-08-20 DIAGNOSIS — R5383 Other fatigue: Secondary | ICD-10-CM | POA: Insufficient documentation

## 2023-08-20 LAB — I-STAT VENOUS BLOOD GAS, ED
Acid-Base Excess: 2 mmol/L (ref 0.0–2.0)
Bicarbonate: 26 mmol/L (ref 20.0–28.0)
Calcium, Ion: 1.14 mmol/L — ABNORMAL LOW (ref 1.15–1.40)
HCT: 35 % — ABNORMAL LOW (ref 36.0–46.0)
Hemoglobin: 11.9 g/dL — ABNORMAL LOW (ref 12.0–15.0)
O2 Saturation: 87 %
Potassium: 3.5 mmol/L (ref 3.5–5.1)
Sodium: 141 mmol/L (ref 135–145)
TCO2: 27 mmol/L (ref 22–32)
pCO2, Ven: 39.1 mmHg — ABNORMAL LOW (ref 44–60)
pH, Ven: 7.431 — ABNORMAL HIGH (ref 7.25–7.43)
pO2, Ven: 52 mmHg — ABNORMAL HIGH (ref 32–45)

## 2023-08-20 LAB — COMPREHENSIVE METABOLIC PANEL
ALT: 16 U/L (ref 0–44)
AST: 17 U/L (ref 15–41)
Albumin: 3.5 g/dL (ref 3.5–5.0)
Alkaline Phosphatase: 78 U/L (ref 38–126)
Anion gap: 11 (ref 5–15)
BUN: 10 mg/dL (ref 6–20)
CO2: 23 mmol/L (ref 22–32)
Calcium: 9.1 mg/dL (ref 8.9–10.3)
Chloride: 105 mmol/L (ref 98–111)
Creatinine, Ser: 0.84 mg/dL (ref 0.44–1.00)
GFR, Estimated: >60 mL/min (ref >60)
Glucose, Bld: 106 mg/dL — ABNORMAL HIGH (ref 70–99)
Potassium: 3.6 mmol/L (ref 3.5–5.1)
Sodium: 139 mmol/L (ref 135–145)
Total Bilirubin: 0.5 mg/dL (ref 0.0–1.2)
Total Protein: 6.8 g/dL (ref 6.5–8.1)

## 2023-08-20 LAB — CBC WITH DIFFERENTIAL/PLATELET
Abs Immature Granulocytes: 0.02 10*3/uL (ref 0.00–0.07)
Basophils Absolute: 0.1 10*3/uL (ref 0.0–0.1)
Basophils Relative: 1 %
Eosinophils Absolute: 0.1 10*3/uL (ref 0.0–0.5)
Eosinophils Relative: 1 %
HCT: 35 % — ABNORMAL LOW (ref 36.0–46.0)
Hemoglobin: 10.9 g/dL — ABNORMAL LOW (ref 12.0–15.0)
Immature Granulocytes: 0 %
Lymphocytes Relative: 36 %
Lymphs Abs: 3.2 10*3/uL (ref 0.7–4.0)
MCH: 24 pg — ABNORMAL LOW (ref 26.0–34.0)
MCHC: 31.1 g/dL (ref 30.0–36.0)
MCV: 76.9 fL — ABNORMAL LOW (ref 80.0–100.0)
Monocytes Absolute: 0.5 10*3/uL (ref 0.1–1.0)
Monocytes Relative: 6 %
Neutro Abs: 5 10*3/uL (ref 1.7–7.7)
Neutrophils Relative %: 56 %
Platelets: 269 10*3/uL (ref 150–400)
RBC: 4.55 MIL/uL (ref 3.87–5.11)
RDW: 15.6 % — ABNORMAL HIGH (ref 11.5–15.5)
WBC: 8.9 10*3/uL (ref 4.0–10.5)
nRBC: 0 % (ref 0.0–0.2)

## 2023-08-20 NOTE — Telephone Encounter (Signed)
 Chief Complaint: Dizziness Symptoms: Lightheadedness, urinary frequency, dry mouth, fatigue, polydipsia, headache Frequency: Constant Pertinent Negatives: Patient denies Vertigo, falls, vision changes, chest pain, dehydration, loss of appetite. Disposition: [] ED /[] Urgent Care (no appt availability in office) / [x] Appointment(In office/virtual)/ []  [facility]/ [] Home Care/ [] Refused Recommended Disposition /[] [facility]/ []  Follow-up with PCP Additional Notes: Patient called with complaints of continued symptoms of dizziness, polydipsia, urinary frequency, dry mouth, and headache (mild). Patient states that symptoms first started five days ago after eating a wing she normally avoids and was seen in the ER yesterday for symptoms where she was prescribed Norvasc and catapres for BP and gave a urine sample that showed ketones. Patient states that her BS at the ER was 95 but her symptoms persist. Patient states she is going about 4 times a day with large amounts of very pale urine, and has increased thirst sensation with dry mouth. Patient states she is dizzy without vertigo, can walk with the dizziness for a short period of time before she feels like she has to sit where the dizziness persists. Patient does not know what her BP is at the moment, and denies falls, syncope, loss of appetite, dehydration, chest pain, tachy or bradycardia, fever, n/v. Patient advised by this RN to be seen within 3 days per protocol, to which patient was agreeable. Appt scheduled. Patient advised by this RN to call back with worsening symptoms. Patient verbalized understanding.  Copied from CRM [id number]. Topic: Clinical - Red Word Triage >> Aug 20, 2023 11:14 AM Payton Doughty wrote: Red Word that prompted transfer to Nurse Triage: glossy eyes, weak, drinking lots of water, lightheadedness, headaches come and go Reason for Disposition  [1] MODERATE dizziness (e.g., interferes with normal activities) AND [2] has  been evaluated by doctor (or NP/PA) for this  Answer Assessment - Initial Assessment Questions 1. DESCRIPTION: "Describe your dizziness."     "I can walk, but my body will feel like I have to sit down. As days go by it's been getting better but today it's back to bad" 2. LIGHTHEADED: "Do you feel lightheaded?" (e.g., somewhat faint, woozy, weak upon standing)     Lightheaded 3. VERTIGO: "Do you feel like either you or the room is spinning or tilting?" (i.e. vertigo)     Denies 4. SEVERITY: "How bad is it?"  "Do you feel like you are going to faint?" "Can you stand and walk?"   - MILD: Feels slightly dizzy, but walking normally.   - MODERATE: Feels unsteady when walking, but not falling; interferes with normal activities (e.g., school, work).   - SEVERE: Unable to walk without falling, or requires assistance to walk without falling; feels like passing out now.      Moderate 5. ONSET:  "When did the dizziness begin?"     Five days ago 6. AGGRAVATING FACTORS: "Does anything make it worse?" (e.g., standing, change in head position)     Standing up, sitting down 7. HEART RATE: "Can you tell me your heart rate?" "How many beats in 15 seconds?"  (Note: not all patients can do this)       Normal 8. CAUSE: "What do you think is causing the dizziness?"     "I think it could be the blood pressure but I'm not really sure." 9. RECURRENT SYMPTOM: "Have you had dizziness before?" If Yes, ask: "When was the last time?" "What happened that time?"     Denies 10. OTHER SYMPTOMS: "Do you have any other  symptoms?" (e.g., fever, chest pain, vomiting, diarrhea, bleeding)       HTN, tiredness, dry mouth, urinary frequency, thirsty, headache (mild)  11. PREGNANCY: "Is there any chance you are pregnant?" "When was your last menstrual period?"       Denies  ER 08/19/23 - urine sample - ketones, prescribed Norvasc, Catapres  Protocols used: Dizziness - Lightheadedness-A-AH

## 2023-08-20 NOTE — Telephone Encounter (Signed)
 noted

## 2023-08-20 NOTE — ED Provider Triage Note (Signed)
 Emergency Medicine Provider Triage Evaluation Note  Sarah Small , a 33 y.o. female  was evaluated in triage.  Pt complains of lightheadedness, fatigue.  Endorsing generalized malaise of the last 48 hours.  Was seen in urgent care yesterday with negative glucose in the urine but very mild ketones in the urine.  Denies chest pain or shortness of breath, fever or other systemic symptoms.  Review of Systems  Positive: Malaise, fatigue Negative: Chest pain, short of breath, vomiting, diarrhea  Physical Exam  BP (!) 162/96 (BP Location: Left Arm)   Pulse 63   Temp 98.6 F (37 C) (Oral)   Resp 18   LMP 08/15/2023 (Exact Date)   SpO2 98%  Gen:   Awake, no distress   Resp:  Normal effort  MSK:   Moves extremities without difficulty  Other:    Medical Decision Making  Medically screening exam initiated at 11:48 PM.  Appropriate orders placed.  Sarah Small was informed that the remainder of the evaluation will be completed by another provider, this initial triage assessment does not replace that evaluation, and the importance of remaining in the ED until their evaluation is complete.     Sarah Score, MD 08/20/23 [phone]

## 2023-08-20 NOTE — Telephone Encounter (Signed)
 Call to patient to advise that she needs an office visit for refills and to offer an earlier appointment in office or on  our MU. Unable to reach VM left.

## 2023-08-20 NOTE — ED Triage Notes (Signed)
 Patient BIB [facility] from home for malaise, polyuria, excessive thirst and feeling unwell x 2 days. UC yesterday reported that her ketones were elevated in her urine, no dx of diabetes reported. A&Ox4, slow to respond, CBG 128 with EMS. VSS.

## 2023-08-20 NOTE — ED Provider Notes (Signed)
 [facility] CARE [facility]ENTER   [id number] 08/19/23 Arrival Time: 1657  ASSESSMENT & PLAN:  1. Acute intractable headache, unspecified headache type   2. Elevated blood pressure reading in office with diagnosis of hypertension   3. Lightheadedness    Meds ordered this encounter  Medications   cloNIDine (CATAPRES) tablet 0.2 mg   amLODipine (NORVASC) 10 MG tablet    Sig: Take 1 tablet (10 mg total) by mouth daily.    Dispense:  30 tablet    Refill:  1   Reports HA has resolved after mild reduction in BP. Refilled Norvasc. Has PCP appt in the near future.  Normal neurological exam.  Recommend:  Follow-up Information     Schedule an appointment as soon as possible for a visit  with Storm Frisk, MD.   Specialty: Pulmonary Disease Why: To discuss your blood pressure. Contact information: [address] [place] [address] [phone]         [facility] Emergency Department at [facility].   Specialty: Emergency Medicine Why: If symptoms worsen in any way. Contact information: [address] [place] [address] [phone]                 Reviewed expectations re: course of current medical issues. Questions answered. Outlined signs and symptoms indicating need for more acute intervention. Patient verbalized understanding. After Visit Summary given.   SUBJECTIVE: History from: Patient. Patient is able to give a clear and coherent history.  Sarah Small is a 32 y.o. female who presents with complaint of a bad headache. Pt states she she is fatigued, weak, dizzy, and has a slight headache X 5 days.   She was out of her meds and just recently had them filled but she states it will take a couple days to get them back. She has a PCP appt next week since she hasn't seen them in awhile. She has been off her meds X 1 year.  Has h/o HAs with increased BP. No extremity sensation changes or weakness. Normal gait. Denies  visual changes.  Has been tx for HTN in past.   OBJECTIVE:  Vitals:   08/19/23 1811 08/19/23 1946  BP: (!) 175/81 (!) 154/92  Pulse: (!) 53 (!) 52  Resp: 18   Temp: 98.5 F (36.9 C)   TempSrc: Oral   SpO2: 98% 98%    General appearance: alert; NAD HENT: normocephalic; atraumatic Eyes: PERRLA; EOMI; conjunctivae normal Neck: supple with FROM Lungs: clear to auscultation bilaterally; unlabored respirations Heart: regular rate and rhythm Extremities: no edema; symmetrical with no gross deformities Skin: warm and dry Neurologic: alert; speech is fluent and clear without dysarthria or aphasia; CN 2-12 grossly intact; no facial droop; normal gait; normal symmetric reflexes; normal extremity strength and sensation throughout; bilateral upper and lower extremity sensation is grossly intact with 5/5 symmetric strength; normal grip strength Psychological: alert and cooperative; normal mood and affect  Labs: Results for orders placed or performed during the hospital encounter of 08/19/23  POC CBG monitoring   Collection Time: 08/19/23  6:34 PM  Result Value Ref Range   POCT Glucose ([facility]) 96 70 - 99 mg/dL  POC urinalysis dipstick   Collection Time: 08/19/23  6:40 PM  Result Value Ref Range   Color, UA yellow yellow   Clarity, UA clear clear   Glucose, UA negative negative mg/dL   Bilirubin, UA negative negative   Ketones, POC UA trace (5) (A) negative mg/dL  Spec Grav, UA 1.025 1.010 - 1.025   Blood, UA trace-intact (A) negative   pH, UA 6.5 5.0 - 8.0   Protein Ur, POC negative negative mg/dL   Urobilinogen, UA 0.2 0.2 or 1.0 E.U./dL   Nitrite, UA Negative Negative   Leukocytes, UA Negative Negative  POCT urine pregnancy   Collection Time: 08/19/23  6:40 PM  Result Value Ref Range   Preg Test, Ur Negative Negative   Labs Reviewed  POCT URINALYSIS DIP (MANUAL ENTRY) - Abnormal; Notable for the following components:      Result Value   Ketones, POC UA trace (5) (*)     Blood, UA trace-intact (*)    All other components within normal limits  POCT URINE PREGNANCY  POCT FASTING CBG [facility] MANUAL ENTRY   Allergies  Allergen Reactions   Ritalin [Methylphenidate Hcl] Rash    Past Medical History:  Diagnosis Date   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   Hypertension    Social History   Socioeconomic History   Marital status: Single    Spouse name: Not on file   Number of children: Not on file   Years of education: Not on file   Highest education level: Not on file  Occupational History   Not on file  Tobacco Use   Smoking status: Every Day    Current packs/day: 0.25    Average packs/day: 0.3 packs/day for 9.0 years (2.3 ttl pk-yrs)    Types: Cigarettes   Smokeless tobacco: Never  Vaping Use   Vaping status: Never Used  Substance and Sexual Activity   Alcohol use: No   Drug use: Yes    Types: Marijuana    Comment: twice a week    Sexual activity: Yes  Other Topics Concern   Not on file  Social History Narrative   ** Merged History Encounter **       Social Drivers of Health   Financial Resource Strain: Low Risk  (10/26/2021)   Overall Financial Resource Strain (CARDIA)    Difficulty of Paying Living Expenses: Not hard at all  Food Insecurity: No Food Insecurity (10/26/2021)   Hunger Vital Sign    Worried About Running Out of Food in the Last Year: Never true    Ran Out of Food in the Last Year: Never true  Transportation Needs: No Transportation Needs (10/26/2021)   PRAPARE - Administrator, Civil Service (Medical): No    Lack of Transportation (Non-Medical): No  Physical Activity: Inactive (10/26/2021)   Exercise Vital Sign    Days of Exercise per Week: 0 days    Minutes of Exercise per Session: 0 min  Stress: Not on file  Social Connections: Not on file  Intimate Partner Violence: Not on file   Family History  Problem Relation Age of Onset   Heart attack Mother    Hypertension Mother    Heart failure Mother         transplant   History reviewed. No pertinent surgical history.    Mardella Layman, MD 08/20/23 [phone]

## 2023-08-21 ENCOUNTER — Emergency Department (HOSPITAL_COMMUNITY): Payer: Self-pay

## 2023-08-21 LAB — URINALYSIS, ROUTINE W REFLEX MICROSCOPIC
Bilirubin Urine: NEGATIVE
Glucose, UA: NEGATIVE mg/dL
Hgb urine dipstick: NEGATIVE
Ketones, ur: NEGATIVE mg/dL
Leukocytes,Ua: NEGATIVE
Nitrite: NEGATIVE
Protein, ur: NEGATIVE mg/dL
Specific Gravity, Urine: 1.019 (ref 1.005–1.030)
pH: 7 (ref 5.0–8.0)

## 2023-08-21 LAB — RESP PANEL BY RT-PCR (RSV, FLU A&B, COVID)  RVPGX2
Influenza A by PCR: NEGATIVE
Influenza B by PCR: NEGATIVE
Resp Syncytial Virus by PCR: NEGATIVE
SARS Coronavirus 2 by RT PCR: NEGATIVE

## 2023-08-21 NOTE — ED Provider Notes (Signed)
 [place] EMERGENCY DEPARTMENT AT [facility] Provider Note   CSN: [id number] Arrival date & time: 08/20/23  2225     History  Chief Complaint  Patient presents with   Polyuria   Fatigue    Sarah Small is a 33 y.o. female.  The history is provided by the patient.   Sarah Small is a 33 y.o. female who presents to the Emergency Department complaining of fatigue.  She presents to the emergency department for evaluation of dizziness and feeling fatigued.  Symptoms have been going on for about 5 days.  She states she feels fine when she is up and walking but when she goes down to sit she feels dizzy and lightheaded.  She does have a mild left sided frontal headache that started 3 days ago.  No associated chest pain, abdominal pain, shortness of breath, cough, fever, nausea, vomiting, diarrhea, dysuria, leg swelling or pain.  She does have a history of hypertension and was seen in urgent care 2 days ago.  She states that there were ketones in her urine on that visit it is unsure the significance of that.  She is compliant with her home blood pressure medications.  She is working a lot and has a 12-hour shifts and finds it hard to eat at times. She uses tobacco.  No alcohol or drug use.    LMP 1 week ago.  No hematochezia or melena.  Denies any chance of pregnancy.  Home Medications Prior to Admission medications   Medication Sig Start Date End Date Taking? Authorizing Provider  albuterol (VENTOLIN HFA) 108 (90 Base) MCG/ACT inhaler Inhale 1-2 puffs into the lungs every 6 (six) hours as needed for wheezing or shortness of breath. 07/22/21   Dahlia Byes A, FNP  amLODipine (NORVASC) 10 MG tablet Take 1 tablet (10 mg total) by mouth daily. 08/19/23   Mardella Layman, MD  amoxicillin (AMOXIL) 500 MG capsule Take 2 capsules (1,000 mg total) by mouth 2 (two) times daily. 03/29/23   Melene Plan, DO  carvedilol (COREG) 25 MG tablet Take 1 tablet (25 mg total) by mouth 2 (two) times daily.  06/25/22 12/22/22  Storm Frisk, MD  clobetasol cream (TEMOVATE) 0.05 % Apply 1 Application topically 2 (two) times daily. To hands 06/25/22   Storm Frisk, MD  escitalopram (LEXAPRO) 10 MG tablet Take 1 tablet (10 mg total) by mouth daily. 06/25/22   Storm Frisk, MD  HYDROcodone-acetaminophen (NORCO/VICODIN) 5-325 MG tablet Take 1 tablet by mouth every 6 (six) hours as needed for severe pain. 01/07/23   Pollyann Savoy, MD  nicotine (NICODERM CQ - DOSED IN MG/24 HOURS) 21 mg/24hr patch Place 1 patch (21 mg total) onto the skin daily. 01/23/22   Storm Frisk, MD  nicotine polacrilex (NICORETTE MINI) 4 MG lozenge Use three times a day to quit smoking 01/23/22   Storm Frisk, MD  pantoprazole (PROTONIX) 40 MG tablet Take 1 tablet (40 mg total) by mouth daily. 06/25/22   Storm Frisk, MD  spironolactone (ALDACTONE) 50 MG tablet Take 1 tablet (50 mg total) by mouth daily. 06/25/22   Storm Frisk, MD  valACYclovir (VALTREX) 1000 MG tablet Take 1 tablet (1,000 mg total) by mouth 3 (three) times daily. 01/07/23   Pollyann Savoy, MD  valsartan (DIOVAN) 320 MG tablet Take 1 tablet (320 mg total) by mouth daily. 06/25/22   Storm Frisk, MD      Allergies    Ritalin [methylphenidate  hcl]    Review of Systems   Review of Systems  All other systems reviewed and are negative.   Physical Exam Updated Vital Signs BP (!) 149/88 (BP Location: Right Arm)   Pulse 71   Temp 98.1 F (36.7 C) (Oral)   Resp 18   LMP 08/15/2023 (Exact Date)   SpO2 99%  Physical Exam Vitals and nursing note reviewed.  Constitutional:      Appearance: She is well-developed.  HENT:     Head: Normocephalic and atraumatic.  Cardiovascular:     Rate and Rhythm: Normal rate and regular rhythm.     Heart sounds: No murmur heard. Pulmonary:     Effort: Pulmonary effort is normal. No respiratory distress.     Breath sounds: Normal breath sounds.  Abdominal:     Palpations: Abdomen is soft.      Tenderness: There is no abdominal tenderness. There is no guarding or rebound.  Musculoskeletal:        General: No swelling or tenderness.  Skin:    General: Skin is warm and dry.  Neurological:     Mental Status: She is alert and oriented to person, place, and time.     Comments: No asymmetry of facial movements.  Visual fields grossly intact.  5 out of 5 strength in all 4 extremities with sensation to light touch intact in all 4 extremities.  No ataxia on finger-to-nose bilaterally.  Psychiatric:        Behavior: Behavior normal.     ED Results / Procedures / Treatments   Labs (all labs ordered are listed, but only abnormal results are displayed) Labs Reviewed  CBC WITH DIFFERENTIAL/PLATELET - Abnormal; Notable for the following components:      Result Value   Hemoglobin 10.9 (*)    HCT 35.0 (*)    MCV 76.9 (*)    MCH 24.0 (*)    RDW 15.6 (*)    All other components within normal limits  COMPREHENSIVE METABOLIC PANEL - Abnormal; Notable for the following components:   Glucose, Bld 106 (*)    All other components within normal limits  I-STAT VENOUS BLOOD GAS, ED - Abnormal; Notable for the following components:   pH, Ven 7.431 (*)    pCO2, Ven 39.1 (*)    pO2, Ven 52 (*)    Calcium, Ion 1.14 (*)    HCT 35.0 (*)    Hemoglobin 11.9 (*)    All other components within normal limits  RESP PANEL BY RT-PCR (RSV, FLU A&B, COVID)  RVPGX2  URINALYSIS, ROUTINE W REFLEX MICROSCOPIC  PREGNANCY, URINE    EKG None  Radiology CT Head Wo Contrast Result Date: 08/21/2023 CLINICAL DATA:  33 year old female with headache, malaise, lightheadedness. EXAM: CT HEAD WITHOUT CONTRAST TECHNIQUE: Contiguous axial images were obtained from the base of the skull through the vertex without intravenous contrast. RADIATION DOSE REDUCTION: This exam was performed according to the departmental dose-optimization program which includes automated exposure control, adjustment of the mA and/or kV according to  patient size and/or use of iterative reconstruction technique. COMPARISON:  None Available. FINDINGS: Brain: Normal cerebral volume. No midline shift, ventriculomegaly, mass effect, evidence of mass lesion, intracranial hemorrhage or evidence of cortically based acute infarction. Gray-white matter differentiation is within normal limits throughout the brain. Partially empty sella. Vascular: No suspicious intracranial vascular hyperdensity. Skull: Intact, negative. Sinuses/Orbits: Visualized paranasal sinuses and mastoids are well aerated. Other: Visualized orbits and scalp soft tissues are within normal limits. IMPRESSION: Normal noncontrast  Head CT aside from partially empty sella, often a normal anatomic variant but can be associated with idiopathic intracranial hypertension (pseudotumor cerebri). Electronically Signed   By: Odessa Fleming M.D.   On: 08/21/2023 04:37    Procedures Procedures    Medications Ordered in ED Medications - No data to display  ED Course/ Medical Decision Making/ A&P                                 Medical Decision Making Amount and/or Complexity of Data Reviewed Radiology: ordered.   Patient with history of hypertension here for evaluation of feeling unwell with dizziness/lightheadedness for the last several days with associated left sided frontal headache.  She describes the headache as mild in nature.  She is nontoxic-appearing on evaluation with no focal neurologic deficits.  She is hypertensive on examination.  She declined any medications for her headache in the emergency department.  CBC with anemia, stable when compared to priors.  CMP is unremarkable.  She had a negative urine pregnancy test on March 4 at urgent care.  Her viral panel is negative.  CT head was obtained given her reported new headaches, vertiginous symptoms.  CT is negative for acute abnormality.  Current clinical picture is not consistent with subarachnoid hemorrhage, meningitis, dural sinus  thrombosis.  No evidence of hypertensive urgency.  Discussed with patient unclear source of her symptoms.  She is working excessive hours and having inconsistent meals, this may be contributing to her symptoms.  Discussed PCP follow-up as well as return precautions.        Final Clinical Impression(s) / ED Diagnoses Final diagnoses:  Other fatigue  Bad headache    Rx / DC Orders ED Discharge Orders     None         Tilden Fossa, MD 08/21/23 [phone]

## 2023-08-22 ENCOUNTER — Ambulatory Visit: Payer: Self-pay | Attending: Nurse Practitioner | Admitting: Nurse Practitioner

## 2023-08-22 ENCOUNTER — Other Ambulatory Visit: Payer: Self-pay

## 2023-08-22 ENCOUNTER — Encounter: Payer: Self-pay | Admitting: Nurse Practitioner

## 2023-08-22 VITALS — BP 160/101 | HR 52 | Ht 64.0 in | Wt 203.6 lb

## 2023-08-22 DIAGNOSIS — R42 Dizziness and giddiness: Secondary | ICD-10-CM

## 2023-08-22 DIAGNOSIS — I10 Essential (primary) hypertension: Secondary | ICD-10-CM

## 2023-08-22 DIAGNOSIS — K219 Gastro-esophageal reflux disease without esophagitis: Secondary | ICD-10-CM

## 2023-08-22 DIAGNOSIS — F17200 Nicotine dependence, unspecified, uncomplicated: Secondary | ICD-10-CM

## 2023-08-22 DIAGNOSIS — Z139 Encounter for screening, unspecified: Secondary | ICD-10-CM

## 2023-08-22 DIAGNOSIS — F17210 Nicotine dependence, cigarettes, uncomplicated: Secondary | ICD-10-CM

## 2023-08-22 DIAGNOSIS — K029 Dental caries, unspecified: Secondary | ICD-10-CM

## 2023-08-22 DIAGNOSIS — L209 Atopic dermatitis, unspecified: Secondary | ICD-10-CM

## 2023-08-22 DIAGNOSIS — D500 Iron deficiency anemia secondary to blood loss (chronic): Secondary | ICD-10-CM

## 2023-08-22 MED ORDER — MECLIZINE HCL 25 MG PO TABS
25.0000 mg | ORAL_TABLET | Freq: Three times a day (TID) | ORAL | 0 refills | Status: DC | PRN
  Filled 2023-08-22: qty 30, 5d supply, fill #0

## 2023-08-22 MED ORDER — VALSARTAN 320 MG PO TABS
320.0000 mg | ORAL_TABLET | Freq: Every day | ORAL | 3 refills | Status: DC
  Filled 2023-08-22: qty 90, 90d supply, fill #0
  Filled 2023-09-12 – 2023-12-17 (×4): qty 90, 90d supply, fill #1

## 2023-08-22 MED ORDER — PANTOPRAZOLE SODIUM 40 MG PO TBEC
40.0000 mg | DELAYED_RELEASE_TABLET | Freq: Every day | ORAL | 3 refills | Status: DC
  Filled 2023-08-22: qty 30, 30d supply, fill #0
  Filled 2023-09-12 – 2023-09-15 (×2): qty 30, 30d supply, fill #1
  Filled 2023-11-24 – 2023-12-17 (×2): qty 30, 30d supply, fill #2

## 2023-08-22 MED ORDER — NICOTINE 21 MG/24HR TD PT24
21.0000 mg | MEDICATED_PATCH | Freq: Every day | TRANSDERMAL | 0 refills | Status: DC
  Filled 2023-08-22: qty 28, 28d supply, fill #0

## 2023-08-22 MED ORDER — IBUPROFEN 800 MG PO TABS
800.0000 mg | ORAL_TABLET | Freq: Three times a day (TID) | ORAL | 1 refills | Status: DC | PRN
  Filled 2023-08-22: qty 60, 20d supply, fill #0

## 2023-08-22 MED ORDER — SPIRONOLACTONE 50 MG PO TABS
50.0000 mg | ORAL_TABLET | Freq: Every day | ORAL | 1 refills | Status: DC
  Filled 2023-08-22: qty 90, 90d supply, fill #0
  Filled 2023-09-12 – 2023-12-17 (×4): qty 90, 90d supply, fill #1

## 2023-08-22 MED ORDER — IRON (FERROUS SULFATE) 325 (65 FE) MG PO TABS
325.0000 mg | ORAL_TABLET | Freq: Every day | ORAL | 1 refills | Status: DC
Start: 2023-08-22 — End: 2023-12-17
  Filled 2023-08-22: qty 90, 90d supply, fill #0
  Filled 2023-09-12 – 2023-12-17 (×3): qty 90, 90d supply, fill #1

## 2023-08-22 MED ORDER — ALBUTEROL SULFATE HFA 108 (90 BASE) MCG/ACT IN AERS
1.0000 | INHALATION_SPRAY | Freq: Four times a day (QID) | RESPIRATORY_TRACT | 0 refills | Status: DC | PRN
  Filled 2023-08-22: qty 6.7, 25d supply, fill #0

## 2023-08-22 MED ORDER — AMLODIPINE BESYLATE 10 MG PO TABS
10.0000 mg | ORAL_TABLET | Freq: Every day | ORAL | 1 refills | Status: DC
  Filled 2023-08-22: qty 30, 30d supply, fill #0
  Filled 2023-09-12 – 2023-09-15 (×2): qty 30, 30d supply, fill #1

## 2023-08-22 MED ORDER — CLOBETASOL PROPIONATE 0.05 % EX CREA
1.0000 | TOPICAL_CREAM | Freq: Two times a day (BID) | CUTANEOUS | 0 refills | Status: DC
  Filled 2023-08-22: qty 30, 30d supply, fill #0

## 2023-08-22 NOTE — Progress Notes (Signed)
 Patient  Assessment & Plan:  Sarah Small was seen today for dizziness.  Diagnoses and all orders for this visit:  Essential hypertension Resume all blood pressure medications at this time. -     amLODipine (NORVASC) 10 MG tablet; Take 1 tablet (10 mg total) by mouth daily. -     spironolactone (ALDACTONE) 50 MG tablet; Take 1 tablet (50 mg total) by mouth daily. -     valsartan (DIOVAN) 320 MG tablet; Take 1 tablet (320 mg total) by mouth daily.  Dizziness Possible aggravating factors include anemia or elevated blood pressure -     meclizine (ANTIVERT) 25 MG tablet; Take 1-2 tablets (25-50 mg total) by mouth 3 (three) times daily as needed for dizziness.  Gastroesophageal reflux disease without esophagitis -     pantoprazole (PROTONIX) 40 MG tablet; Take 1 tablet (40 mg total) by mouth daily. INSTRUCTIONS: Avoid GERD Triggers: acidic, spicy or fried foods, caffeine, coffee, sodas,  alcohol and chocolate.    Encounter for screening involving social determinants of health (SDoH) -     AMB Referral [facility] Care Management  Anemia due to blood loss, chronic -     Iron, Ferrous Sulfate, 325 (65 Fe) MG TABS; Take 1 tablet (325 mg) by mouth daily. For anemia  Pain due to dental caries -     ibuprofen (ADVIL) 800 MG tablet; Take 1 tablet (800 mg total) by mouth every 8 (eight) hours as needed.  Tobacco dependence -     albuterol (VENTOLIN HFA) 108 (90 Base) MCG/ACT inhaler; Inhale 1-2 puffs into the lungs every 6 (six) hours as needed for wheezing or shortness of breath. -     nicotine (NICODERM CQ - DOSED IN MG/24 HOURS) 21 mg/24hr patch; Place 1 patch (21 mg total) onto the skin daily.  Atopic dermatitis of both hands -     clobetasol cream (TEMOVATE) 0.05 %; Apply 1 Application topically 2 (two) times daily. To hands    Patient has been counseled on age-appropriate routine health concerns for screening and prevention. These are reviewed and up-to-date. Referrals have been placed  accordingly. Immunizations are up-to-date or declined.    Subjective:   Chief Complaint  Patient presents with   Dizziness    Patient stated that its been happening for 6 day.    Sarah Small 33 y.o. female presents to office today for follow up to HTN and with complaints of dizziness.    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   She is a patient of Dr. Delford Field with last visit almost 1 year ago.     HTN Blood pressure significantly elevated.  We will need to resume her blood pressure medications today which include: Spironolactone 50 mg daily, valsartan 320 mg daily and amlodipine 10 mg daily.  She does exhibit bradycardia today and will hold off on resuming her carvedilol at this time.  Reports dizziness upon awaking which can last a few hours.  She does not have a blood pressure device as is unaware of what her morning blood pressure readings are. BP Readings from Last 3 Encounters:  08/22/23 (!) 160/101  08/21/23 (!) 149/88  08/19/23 (!) 154/92    Menstrual cycles are heavy. Clots present.  Length of menstrual cycles is usually 5 days and sometimes heavy the entire duration of the cycle.  She does have anemia as well and we will start her on iron for this today.     She  was evaluatated by ENT for witnessed apneas, daytime fatigue and elongated uvula. They ordered a sleep study through the company [employer]. Unfortunately, patient did not sleep long enough for adequate data and will need to repeat this. However, she can not financially afford the fee at this time. Her wife reports they are obtaining [place] insurance soon and is wondering if there is another option to order the study.     She is requesting medication for dental pain. Review of Systems  Constitutional:  Negative for fever, malaise/fatigue and weight loss.  HENT:  Negative for nosebleeds.        Dental pain  Eyes: Negative.  Negative for blurred  vision, double vision and photophobia.  Respiratory: Negative.  Negative for cough and shortness of breath.   Cardiovascular: Negative.  Negative for chest pain, palpitations and leg swelling.  Gastrointestinal: Negative.  Negative for heartburn, nausea and vomiting.  Musculoskeletal: Negative.  Negative for myalgias.  Neurological:  Positive for dizziness. Negative for focal weakness, seizures and headaches.  Psychiatric/Behavioral: Negative.  Negative for suicidal ideas.     Past Medical History:  Diagnosis Date   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   Hypertension     History reviewed. No pertinent surgical history.  Family History  Problem Relation Age of Onset   Heart attack Mother    Hypertension Mother    Heart failure Mother        transplant    Social History Reviewed with no changes to be made today.   Outpatient Medications Prior to Visit  Medication Sig Dispense Refill   valACYclovir (VALTREX) 1000 MG tablet Take 1 tablet (1,000 mg total) by mouth 3 (three) times daily. 21 tablet 0   albuterol (VENTOLIN HFA) 108 (90 Base) MCG/ACT inhaler Inhale 1-2 puffs into the lungs every 6 (six) hours as needed for wheezing or shortness of breath. 1 each 0   amLODipine (NORVASC) 10 MG tablet Take 1 tablet (10 mg total) by mouth daily. 30 tablet 1   amoxicillin (AMOXIL) 500 MG capsule Take 2 capsules (1,000 mg total) by mouth 2 (two) times daily. 40 capsule 0   clobetasol cream (TEMOVATE) 0.05 % Apply 1 Application topically 2 (two) times daily. To hands 30 g 0   escitalopram (LEXAPRO) 10 MG tablet Take 1 tablet (10 mg total) by mouth daily. 30 tablet 3   HYDROcodone-acetaminophen (NORCO/VICODIN) 5-325 MG tablet Take 1 tablet by mouth every 6 (six) hours as needed for severe pain. 12 tablet 0   nicotine (NICODERM CQ - DOSED IN MG/24 HOURS) 21 mg/24hr patch Place 1 patch (21 mg total) onto the skin daily. 28 patch 0   nicotine polacrilex (NICORETTE MINI) 4 MG lozenge Use three times a  day to quit smoking 100 tablet 4   pantoprazole (PROTONIX) 40 MG tablet Take 1 tablet (40 mg total) by mouth daily. 30 tablet 3   spironolactone (ALDACTONE) 50 MG tablet Take 1 tablet (50 mg total) by mouth daily. 90 tablet 1   valsartan (DIOVAN) 320 MG tablet Take 1 tablet (320 mg total) by mouth daily. 90 tablet 3   carvedilol (COREG) 25 MG tablet Take 1 tablet (25 mg total) by mouth 2 (two) times daily. 60 tablet 5   No facility-administered medications prior to visit.    Allergies  Allergen Reactions   Ritalin [Methylphenidate Hcl] Rash       Objective:    BP (!) 160/101 (BP Location: Right Arm, Cuff Size: Normal)   Pulse (!) 52  Ht 5\' 4"  (1.626 m)   Wt 203 lb 9.6 oz (92.4 kg)   LMP 08/15/2023 (Exact Date)   SpO2 100%   BMI 34.95 kg/m  Wt Readings from Last 3 Encounters:  08/22/23 203 lb 9.6 oz (92.4 kg)  01/07/23 208 lb (94.3 kg)  10/31/22 210 lb (95.3 kg)    Physical Exam Vitals and nursing note reviewed.  Constitutional:      Appearance: She is well-developed.  HENT:     Head: Normocephalic and atraumatic.     Right Ear: Hearing, tympanic membrane, ear canal and external ear normal.     Left Ear: Hearing, tympanic membrane, ear canal and external ear normal.  Cardiovascular:     Rate and Rhythm: Normal rate and regular rhythm.     Heart sounds: Normal heart sounds. No murmur heard.    No friction rub. No gallop.  Pulmonary:     Effort: Pulmonary effort is normal. No tachypnea or respiratory distress.     Breath sounds: Normal breath sounds. No decreased breath sounds, wheezing, rhonchi or rales.  Chest:     Chest wall: No tenderness.  Abdominal:     General: Bowel sounds are normal.     Palpations: Abdomen is soft.  Musculoskeletal:        General: Normal range of motion.     Cervical back: Normal range of motion.  Skin:    General: Skin is warm and dry.  Neurological:     Mental Status: She is alert and oriented to person, place, and time.      Coordination: Coordination normal.  Psychiatric:        Behavior: Behavior normal. Behavior is cooperative.        Thought Content: Thought content normal.        Judgment: Judgment normal.          Patient has been counseled extensively about nutrition and exercise as well as the importance of adherence with medications and regular follow-up. The patient was given clear instructions to go to ER or return to medical center if symptoms don't improve, worsen or new problems develop. The patient verbalized understanding.   Follow-up: Return in about 4 weeks (around 09/19/2023) for BP recheck.   Claiborne Rigg, FNP-BC [facility] [place], [place] [phone]   08/22/2023, 9:37 AM

## 2023-08-25 ENCOUNTER — Telehealth: Payer: Self-pay

## 2023-08-25 NOTE — Progress Notes (Signed)
 Complex Care Management Note Care Guide Note  08/25/2023 Name: Sarah Small MRN: [id number] DOB: 11/13/1990   Complex Care Management Outreach Attempts: An unsuccessful telephone outreach was attempted today to offer the patient information about available complex care management services.  Follow Up Plan:  Additional outreach attempts will be made to offer the patient complex care management information and services.   Encounter Outcome:  No Answer  Cadel Stairs [facility]  [facility] Guide Direct Dial: [phone]  Fax: [phone] Website: [email or web address]

## 2023-08-27 ENCOUNTER — Telehealth: Payer: Self-pay

## 2023-08-27 NOTE — Progress Notes (Signed)
 Complex Care Management Note Care Guide Note  08/27/2023 Name: Sarah Small MRN: [id number] DOB: June 24, 1990   Complex Care Management Outreach Attempts: A second unsuccessful outreach was attempted today to offer the patient with information about available complex care management services.  Follow Up Plan:  Additional outreach attempts will be made to offer the patient complex care management information and services.   Encounter Outcome:  No Answer  Leonce Bale [facility]  [facility] Guide Direct Dial: [phone]  Fax: [phone] Website: [email or web address]

## 2023-08-27 NOTE — Progress Notes (Deleted)
 Established Patient Office Visit  Subjective   Patient ID: Sarah Small, female    DOB: 08-25-1990  Age: 33 y.o. MRN: [id number]  No chief complaint on file.   02/2022 Sarah Small is a 33 year old female with history of hypertension, obstructive sleep apnea and tobacco use. She presents today with her wife for follow up.   She was referred to cardiology for hypertension and intermittent palpations. They performed an echocardiogram and a cardiac monitor trial- results below. They also suggested she increase her daily carvedilol to 25 mg twice a day. She has not done this yet and is still taking 12.5 mg BID. Her blood pressure today is 146/95. She does take her blood pressure at home and reports her numbers run around this as well. Other blood pressure medications include: amlodipine 20 mg daily, aldactone 25 mg daily and valsartan 320 mg daily. She does still feel intermittent palpitations, most recently last night, during activity with her wife. She reports that her wife calming her down and instructing her to breathe alleviated the symptoms.   She was evaluatated by ENT for witnessed apneas, daytime fatigue and elongated uvula. They ordered a sleep study through the company [employer]. Unfortunately, patient did not sleep long enough for adequate data and will need to repeat this. However, she can not financially afford the fee at this time. Her wife reports they are obtaining [place] insurance soon and is wondering if there is another option to order the study.   She is still smoking cigarettes and is interested in the nicotine patches. Reports cardiology prescribed these for her but she has not started because it is not the correct level for the amount she smokes. Notes a recent increase in use due to increase in stress with work.  She has been experiencing a hand and foot rash with itching. Rash occurs on the dorsal side of hands and the plantar surface of her feet. Denies any new cleaning or  beauty products.   06/25/22 Patient seen on return follow-up note since last visit she had a sleep study which was normal.  She still smoking 5 cigarettes daily.  On arrival blood pressure elevated 143/85 it is rechecked and accurate.  She states at home she runs the same numbers.  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  Patient would like a letter to her employer to see if she can be switched to a day job because of the agitation and its effect on her health.  She has no other real complaints at this visit.  She did score very high on her GAD-7 and PHQ-9 and is not on mental health medications at this time.  10/30/22 Patient seen today for blood pressure follow-up blood pressure on arrival is good 138/78.  She is taking her current medications as prescribed. Patient with 2 fractured molars and quite a bit of pain She does not have Medicaid she needs to get this applied  08/29/23 Was just seen by new pcp [place] on 08/22/23 Essential hypertension Resume all blood pressure medications at this time. -     amLODipine (NORVASC) 10 MG tablet; Take 1 tablet (10 mg total) by mouth daily. -     spironolactone (ALDACTONE) 50 MG tablet; Take 1 tablet (50 mg total) by mouth daily. -     valsartan (DIOVAN) 320 MG tablet;  Take 1 tablet (320 mg total) by mouth daily.   Dizziness Possible aggravating factors include anemia or elevated blood pressure -     meclizine (ANTIVERT) 25 MG tablet; Take 1-2 tablets (25-50 mg total) by mouth 3 (three) times daily as needed for dizziness.   Gastroesophageal reflux disease without esophagitis -     pantoprazole (PROTONIX) 40 MG tablet; Take 1 tablet (40 mg total) by mouth daily. INSTRUCTIONS: Avoid GERD Triggers: acidic, spicy or fried foods, caffeine, coffee, sodas,  alcohol and chocolate.     Encounter for screening involving social determinants of  health (SDoH) -     AMB Referral [facility] Care Management   Anemia due to blood loss, chronic -     Iron, Ferrous Sulfate, 325 (65 Fe) MG TABS; Take 1 tablet (325 mg) by mouth daily. For anemia   Pain due to dental caries -     ibuprofen (ADVIL) 800 MG tablet; Take 1 tablet (800 mg total) by mouth every 8 (eight) hours as needed.   Tobacco dependence -     albuterol (VENTOLIN HFA) 108 (90 Base) MCG/ACT inhaler; Inhale 1-2 puffs into the lungs every 6 (six) hours as needed for wheezing or shortness of breath. -     nicotine (NICODERM CQ - DOSED IN MG/24 HOURS) 21 mg/24hr patch; Place 1 patch (21 mg total) onto the skin daily.   Atopic dermatitis of both hands -     clobetasol cream (TEMOVATE) 0.05 %; Apply 1 Application topically 2 (two) times daily. 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 Social History   Tobacco Use   Smoking status: Every Day    Current packs/day: 0.25    Average packs/day: 0.3 packs/day for 9.0 years (2.3 ttl pk-yrs)    Types: Cigarettes   Smokeless tobacco: Never  Vaping Use   Vaping status: Never Used  Substance Use Topics   Alcohol use: No   Drug use: Yes    Types: Marijuana    Comment: twice a week    Family History  Problem Relation Age of Onset   Heart attack Mother    Hypertension  Mother    Heart failure Mother        transplant   Allergies  Allergen Reactions   Ritalin [Methylphenidate Hcl] Rash      Review of Systems  Constitutional:  Negative for chills and fever.  HENT:  Negative for congestion, hearing loss and sore throat.        Dental pain  Eyes: Negative.   Respiratory:  Negative for cough and sputum production.   Cardiovascular:  Negative for chest pain and leg swelling.  Gastrointestinal: Negative.   Genitourinary: Negative.   Musculoskeletal: Negative.   Skin:  Negative for itching and rash.  Neurological: Negative.   Endo/Heme/Allergies: Negative.  Psychiatric/Behavioral:  Negative for depression and suicidal ideas. The patient is not nervous/anxious and does not have insomnia.       Objective:     LMP 08/15/2023 (Exact Date)     Physical Exam Vitals reviewed.  Constitutional:      Appearance: Normal appearance. She is well-developed. She is obese. She is not diaphoretic.  HENT:     Head: Normocephalic and atraumatic.     Right Ear: Tympanic membrane, ear canal and external ear normal.     Left Ear: Tympanic membrane, ear canal and external ear normal.     Nose: No nasal deformity, septal deviation, mucosal edema or rhinorrhea.     Right Sinus: No maxillary sinus tenderness or frontal sinus tenderness.     Left Sinus: No maxillary sinus tenderness or frontal sinus tenderness.     Mouth/Throat:     Mouth: Mucous membranes are moist.     Pharynx: Oropharynx is clear. No oropharyngeal exudate.     Comments: Fractured molars bilateral lower jaw with gingivitis no abscess Eyes:     General: No scleral icterus.    Conjunctiva/sclera: Conjunctivae normal.     Pupils: Pupils are equal, round, and reactive to light.  Neck:     Thyroid: No thyromegaly.     Vascular: No carotid bruit or JVD.     Trachea: Trachea normal. No tracheal tenderness or tracheal deviation.  Cardiovascular:     Rate and Rhythm: Normal rate and regular rhythm.      Chest Wall: PMI is not displaced.     Pulses: Normal pulses. No decreased pulses.     Heart sounds: Normal heart sounds, S1 normal and S2 normal. Heart sounds not distant. No murmur heard.    No systolic murmur is present.     No diastolic murmur is present.     No friction rub. No gallop. No S3 or S4 sounds.  Pulmonary:     Effort: Pulmonary effort is normal. No tachypnea, accessory muscle usage or respiratory distress.     Breath sounds: Normal breath sounds. No stridor. No decreased breath sounds, wheezing, rhonchi or rales.  Chest:     Chest wall: No tenderness.  Abdominal:     General: Bowel sounds are normal. There is no distension.     Palpations: Abdomen is soft. Abdomen is not rigid.     Tenderness: There is no abdominal tenderness. There is no guarding or rebound.  Musculoskeletal:        General: Normal range of motion.     Cervical back: Normal range of motion and neck supple. No edema, erythema or rigidity. No muscular tenderness. Normal range of motion.  Lymphadenopathy:     Head:     Right side of head: No submental or submandibular adenopathy.     Left side of head: No submental or submandibular adenopathy.     Cervical: No cervical adenopathy.  Skin:    General: Skin is warm and dry.     Capillary Refill: Capillary refill takes less than 2 seconds.     Coloration: Skin is not pale.     Findings: No rash.     Nails: There is no clubbing.     Comments: Multiple scattered erythematous papules bilateral dorsal surface of hand Multiple small areas of hyperpigmentation bottom of bilateral feet  Fungal excoriations present both feet   Neurological:     Mental Status: She is alert and oriented to person, place, and time.     Sensory:  No sensory deficit.  Psychiatric:        Attention and Perception: Attention and perception normal.        Mood and Affect: Mood is anxious. Affect is tearful.        Speech: Speech normal.        Behavior: Behavior normal.         Thought Content: Thought content is not paranoid or delusional. Thought content includes suicidal ideation. Thought content does not include homicidal ideation. Thought content does not include homicidal or suicidal plan.        Cognition and Memory: Cognition and memory normal.        Judgment: Judgment normal.   RECENT CV STUDIES Echocardiogram 11/08/2021   1. Left ventricular ejection fraction, by estimation, is 55 to 60%. The  left ventricle has normal function. The left ventricle has no regional  wall motion abnormalities. Left ventricular diastolic parameters were  normal. The average left ventricular  global longitudinal strain is -15.8 %. The global longitudinal strain is  abnormal.   2. Right ventricular systolic function is normal. The right ventricular  size is normal.   3. Left atrial size was mildly dilated.   4. The mitral valve is normal in structure. Trivial mitral valve  regurgitation. No evidence of mitral stenosis.   5. The aortic valve is tricuspid. Aortic valve regurgitation is not  visualized. No aortic stenosis is present.   6. The inferior vena cava is normal in size with greater than 50%  respiratory variability, suggesting right atrial pressure of 3 mmHg.     20 Day Event Monitor 12/07/2021   Quality: Fair.  Baseline artifact. Predominant rhythm: sinus rhythm Average heart rate: 78 bpm Max heart rate: 131 bpm Min heart rate: 52 bpm Pauses >2.5 seconds: none Occasional PVCs   No results found for any visits on 08/28/23.     The ASCVD Risk score (Arnett DK, et al., 2019) failed to calculate for the following reasons:   The 2019 ASCVD risk score is only valid for ages 17 to 65    Assessment & Plan:   Problem List Items Addressed This Visit   None  spent on history /physical and patient education  No follow-ups on file.    Shan Levans, MD

## 2023-08-28 ENCOUNTER — Telehealth: Payer: Self-pay

## 2023-08-28 ENCOUNTER — Ambulatory Visit: Payer: Self-pay | Admitting: Critical Care Medicine

## 2023-08-28 NOTE — Progress Notes (Signed)
 Complex Care Management Note Care Guide Note  08/28/2023 Name: Sarah Small MRN: [id number] DOB: 1990-07-25   Complex Care Management Outreach Attempts: A third unsuccessful outreach was attempted today to offer the patient with information about available complex care management services.  Follow Up Plan:  No further outreach attempts will be made at this time. We have been unable to contact the patient to offer or enroll patient in complex care management services.  Encounter Outcome:  No Answer  Jacobey Gura [facility]  [facility] Guide Direct Dial: [phone]  Fax: [phone] Website: [email or web address]

## 2023-09-12 ENCOUNTER — Other Ambulatory Visit: Payer: Self-pay

## 2023-09-15 ENCOUNTER — Other Ambulatory Visit: Payer: Self-pay

## 2023-09-19 ENCOUNTER — Encounter: Payer: Self-pay | Admitting: Nurse Practitioner

## 2023-09-19 ENCOUNTER — Ambulatory Visit: Payer: Self-pay | Attending: Nurse Practitioner | Admitting: Nurse Practitioner

## 2023-09-19 ENCOUNTER — Other Ambulatory Visit: Payer: Self-pay

## 2023-09-19 DIAGNOSIS — I10 Essential (primary) hypertension: Secondary | ICD-10-CM

## 2023-09-19 MED ORDER — AMLODIPINE BESYLATE 10 MG PO TABS
10.0000 mg | ORAL_TABLET | Freq: Every day | ORAL | 1 refills | Status: DC
  Filled 2023-09-19 – 2023-12-17 (×3): qty 90, 90d supply, fill #0

## 2023-09-19 NOTE — Progress Notes (Signed)
 Assessment & Plan:  [name] was seen today for hypertension.  Diagnoses and all orders for this visit:  Essential hypertension -     amLODipine (NORVASC) 10 MG tablet; Take 1 tablet (10 mg total) by mouth daily. Continue all antihypertensives as prescribed.  Reminded to bring in blood pressure log for follow  up appointment.  RECOMMENDATIONS: DASH/Mediterranean Diets are healthier choices for HTN.     Patient has been counseled on age-appropriate routine health concerns for screening and prevention. These are reviewed and up-to-date. Referrals have been placed accordingly. Immunizations are up-to-date or declined.    Subjective:   Chief Complaint  Patient presents with   Hypertension    HTN f/u. Discuss carvedilol to determine if still needed     Sarah Small 33 y.o. female presents to office today for follow up to HTN.  Blood pressure is well-controlled today.  Her weight is gradually decreasing.  She is currently taking amlodipine 10 mg daily, valsartan 320 mg daily and spironolactone 50 mg daily.  She is making better food choices and trying to stay active.  She does continue to smoke but is trying to quit BP Readings from Last 3 Encounters:  09/19/23 122/76  08/22/23 (!) 160/101  08/21/23 (!) 149/88     Review of Systems  Constitutional:  Negative for fever, malaise/fatigue and weight loss.  HENT: Negative.  Negative for nosebleeds.   Eyes: Negative.  Negative for blurred vision, double vision and photophobia.  Respiratory: Negative.  Negative for cough and shortness of breath.   Cardiovascular: Negative.  Negative for chest pain, palpitations and leg swelling.  Gastrointestinal: Negative.  Negative for heartburn, nausea and vomiting.  Musculoskeletal: Negative.  Negative for myalgias.  Neurological: Negative.  Negative for dizziness, focal weakness, seizures and headaches.  Psychiatric/Behavioral: Negative.  Negative for suicidal ideas.     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  Family History  Problem Relation Age of Onset   Heart attack Mother    Hypertension Mother    Heart failure Mother        transplant    Social History Reviewed with no changes to be made today.   Outpatient Medications Prior to Visit  Medication Sig Dispense Refill   albuterol (VENTOLIN HFA) 108 (90 Base) MCG/ACT inhaler Inhale 1-2 puffs into the lungs every 6 (six) hours as needed for wheezing or shortness of breath. 6.7 g 0   clobetasol cream (TEMOVATE) 0.05 % Apply 1 Application topically 2 (two) times daily. To hands 30 g 0   ibuprofen (ADVIL) 800 MG tablet Take 1 tablet (800 mg total) by mouth every 8 (eight) hours as needed. 60 tablet 1   Iron, Ferrous Sulfate, 325 (65 Fe) MG TABS Take 1 tablet (325 mg) by mouth daily. For anemia 90 tablet 1   meclizine (ANTIVERT) 25 MG tablet Take 1-2 tablets (25-50 mg total) by mouth 3 (three) times daily as needed for dizziness. 30 tablet 0   nicotine (NICODERM CQ - DOSED IN MG/24 HOURS) 21 mg/24hr patch Place 1 patch (21 mg total) onto the skin daily. 42 patch 0   pantoprazole (PROTONIX) 40 MG tablet Take 1 tablet (40 mg total) by mouth daily. 30 tablet 3   spironolactone (ALDACTONE) 50 MG tablet Take 1 tablet (50 mg total) by mouth daily. 90 tablet 1   valACYclovir (VALTREX) 1000 MG tablet Take 1 tablet (1,000  mg total) by mouth 3 (three) times daily. 21 tablet 0   valsartan (DIOVAN) 320 MG tablet Take 1 tablet (320 mg total) by mouth daily. 90 tablet 3   amLODipine (NORVASC) 10 MG tablet Take 1 tablet (10 mg total) by mouth daily. 30 tablet 1   No facility-administered medications prior to visit.    Allergies  Allergen Reactions   Ritalin [Methylphenidate Hcl] Rash       Objective:    BP 122/76 (BP Location: Left Arm, Patient Position: Sitting, Cuff Size: Large)   Pulse 70   Temp 98 F (36.7 C) (Oral)   Ht 5\' 4"  (1.626  m)   Wt 206 lb (93.4 kg)   SpO2 98%   BMI 35.36 kg/m  Wt Readings from Last 3 Encounters:  09/19/23 206 lb (93.4 kg)  08/22/23 203 lb 9.6 oz (92.4 kg)  01/07/23 208 lb (94.3 kg)    Physical Exam Vitals and nursing note reviewed.  Constitutional:      Appearance: She is well-developed.  HENT:     Head: Normocephalic and atraumatic.  Cardiovascular:     Rate and Rhythm: Normal rate and regular rhythm.     Heart sounds: Normal heart sounds. No murmur heard.    No friction rub. No gallop.  Pulmonary:     Effort: Pulmonary effort is normal. No tachypnea or respiratory distress.     Breath sounds: Normal breath sounds. No decreased breath sounds, wheezing, rhonchi or rales.  Chest:     Chest wall: No tenderness.  Abdominal:     General: Bowel sounds are normal.     Palpations: Abdomen is soft.  Musculoskeletal:        General: Normal range of motion.     Cervical back: Normal range of motion.  Skin:    General: Skin is warm and dry.  Neurological:     Mental Status: She is alert and oriented to person, place, and time.     Coordination: Coordination normal.  Psychiatric:        Behavior: Behavior normal. Behavior is cooperative.        Thought Content: Thought content normal.        Judgment: Judgment normal.          Patient has been counseled extensively about nutrition and exercise as well as the importance of adherence with medications and regular follow-up. The patient was given clear instructions to go to ER or return to medical center if symptoms don't improve, worsen or new problems develop. The patient verbalized understanding.   Follow-up: Return in about 3 months (around 12/19/2023).   Claiborne Rigg, FNP-BC [facility] [place], [place] [phone]   09/19/2023, 12:17 PM

## 2023-10-13 ENCOUNTER — Encounter (HOSPITAL_BASED_OUTPATIENT_CLINIC_OR_DEPARTMENT_OTHER): Payer: Self-pay

## 2023-11-24 ENCOUNTER — Other Ambulatory Visit: Payer: Self-pay

## 2023-11-24 ENCOUNTER — Other Ambulatory Visit: Payer: Self-pay | Admitting: Nurse Practitioner

## 2023-11-24 DIAGNOSIS — L209 Atopic dermatitis, unspecified: Secondary | ICD-10-CM

## 2023-11-24 MED ORDER — CLOBETASOL PROPIONATE 0.05 % EX CREA
1.0000 | TOPICAL_CREAM | Freq: Two times a day (BID) | CUTANEOUS | 0 refills | Status: AC
  Filled 2023-11-24: qty 30, 15d supply, fill #0
  Filled 2023-12-17: qty 30, 30d supply, fill #0

## 2023-11-28 ENCOUNTER — Ambulatory Visit (HOSPITAL_BASED_OUTPATIENT_CLINIC_OR_DEPARTMENT_OTHER): Payer: Self-pay | Admitting: Family

## 2023-12-03 ENCOUNTER — Other Ambulatory Visit: Payer: Self-pay

## 2023-12-17 ENCOUNTER — Ambulatory Visit: Payer: Self-pay | Attending: Nurse Practitioner | Admitting: Nurse Practitioner

## 2023-12-17 ENCOUNTER — Other Ambulatory Visit: Payer: Self-pay

## 2023-12-17 ENCOUNTER — Encounter: Payer: Self-pay | Admitting: Nurse Practitioner

## 2023-12-17 ENCOUNTER — Other Ambulatory Visit: Payer: Self-pay | Admitting: Nurse Practitioner

## 2023-12-17 VITALS — BP 135/83 | HR 73 | Resp 19 | Ht 64.0 in | Wt 201.6 lb

## 2023-12-17 DIAGNOSIS — D649 Anemia, unspecified: Secondary | ICD-10-CM

## 2023-12-17 DIAGNOSIS — D500 Iron deficiency anemia secondary to blood loss (chronic): Secondary | ICD-10-CM

## 2023-12-17 DIAGNOSIS — I10 Essential (primary) hypertension: Secondary | ICD-10-CM

## 2023-12-17 MED ORDER — IRON (FERROUS SULFATE) 325 (65 FE) MG PO TABS: 325.0000 mg | ORAL_TABLET | ORAL | 1 refills | Status: DC

## 2023-12-17 MED ORDER — SPIRONOLACTONE 50 MG PO TABS: 50.0000 mg | ORAL_TABLET | Freq: Every day | ORAL | 1 refills | Status: DC

## 2023-12-17 NOTE — Progress Notes (Signed)
 Assessment & Plan:  Sarah Small was seen today for hypertension.  Diagnoses and all orders for this visit:  Primary hypertension -     spironolactone  (ALDACTONE ) 50 MG tablet; Take 1 tablet (50 mg total) by mouth daily. Continue all antihypertensives as prescribed.  Reminded to bring in blood pressure log for follow  up appointment.  RECOMMENDATIONS: DASH/Mediterranean Diets are healthier choices for HTN.    Anemia, unspecified type -     CBC with Differential  Anemia due to blood loss, chronic Dose change Also recommended for DX however this is not covered by any insurance. -     Iron , Ferrous Sulfate , 325 (65 Fe) MG TABS; Take 325 mg by mouth every 3 (three) days.    Patient has been counseled on age-appropriate routine health concerns for screening and prevention. These are reviewed and up-to-date. Referrals have been placed accordingly. Immunizations are up-to-date or declined.    Subjective:   Chief Complaint  Patient presents with   Hypertension    Sarah Small 33 y.o. female presents to office today for follow up to HTN  She was in [place] for 1 month earlier this year. 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   She has a past medical history of Congenital heart anomaly (12/08/2021) and Hypertension.    Blood pressure is well-controlled.  She is taking amlodipine  10 mg daily, spironolactone  50 mg daily and valsartan  320 mg daily as prescribed. BP Readings from Last 3 Encounters:  12/17/23 135/83  09/19/23 122/76  08/22/23 (!) 160/101    She has a history of iron  deficiency anemia and has been taking iron  however she is currently endorsing worsening constipation with taking.    Review of Systems  Constitutional:  Negative for fever, malaise/fatigue and weight loss.  HENT: Negative.  Negative for nosebleeds.   Eyes: Negative.  Negative for blurred vision, double vision and photophobia.  Respiratory: Negative.  Negative for cough and shortness of breath.    Cardiovascular: Negative.  Negative for chest pain, palpitations and leg swelling.  Gastrointestinal: Negative.  Negative for heartburn, nausea and vomiting.  Musculoskeletal: Negative.  Negative for myalgias.  Neurological: Negative.  Negative for dizziness, focal weakness, seizures and headaches.  Psychiatric/Behavioral: Negative.  Negative for suicidal ideas.     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  Family History  Problem Relation Age of Onset   Heart attack Mother    Hypertension Mother    Heart failure Mother        transplant    Social History Reviewed with no changes to be made today.   Outpatient Medications Prior to Visit  Medication Sig Dispense Refill   albuterol  (VENTOLIN  HFA) 108 (90 Base) MCG/ACT inhaler Inhale 1-2 puffs into the lungs every 6 (six) hours as needed for wheezing or shortness of breath. 6.7 g 0   amLODipine  (NORVASC ) 10 MG tablet Take 1 tablet (10 mg total) by mouth daily. 90 tablet 1   clobetasol  cream (TEMOVATE ) 0.05 % Apply 1 Application topically 2 (two) times daily. To hands 30 g 0   ibuprofen  (ADVIL ) 800 MG tablet Take 1 tablet (800 mg total) by mouth every 8 (eight) hours as needed. 60 tablet 1   pantoprazole  (PROTONIX ) 40 MG tablet Take 1 tablet (40 mg total) by mouth daily. 30 tablet 3   valsartan  (DIOVAN ) 320 MG tablet Take 1 tablet (320 mg total) by mouth  daily. 90 tablet 3   Iron , Ferrous Sulfate , 325 (65 Fe) MG TABS Take 1 tablet (325 mg) by mouth daily. For anemia 90 tablet 1   spironolactone  (ALDACTONE ) 50 MG tablet Take 1 tablet (50 mg total) by mouth daily. 90 tablet 1   meclizine  (ANTIVERT ) 25 MG tablet Take 1-2 tablets (25-50 mg total) by mouth 3 (three) times daily as needed for dizziness. (Patient not taking: Reported on 12/17/2023) 30 tablet 0   nicotine  (NICODERM CQ  - DOSED IN MG/24 HOURS) 21 mg/24hr patch Place 1 patch (21 mg total) onto  the skin daily. (Patient not taking: Reported on 12/17/2023) 42 patch 0   valACYclovir  (VALTREX ) 1000 MG tablet Take 1 tablet (1,000 mg total) by mouth 3 (three) times daily. (Patient not taking: Reported on 12/17/2023) 21 tablet 0   No facility-administered medications prior to visit.    Allergies  Allergen Reactions   Ritalin [Methylphenidate Hcl] Rash       Objective:    BP 135/83 (BP Location: Left Arm, Patient Position: Sitting, Cuff Size: Normal)   Pulse 73   Resp 19   Ht 5' 4 (1.626 m)   Wt 201 lb 9.6 oz (91.4 kg)   LMP 11/10/2023 (Exact Date)   SpO2 98%   BMI 34.60 kg/m  Wt Readings from Last 3 Encounters:  12/17/23 201 lb 9.6 oz (91.4 kg)  09/19/23 206 lb (93.4 kg)  08/22/23 203 lb 9.6 oz (92.4 kg)    Physical Exam Vitals and nursing note reviewed.  Constitutional:      Appearance: She is well-developed.  HENT:     Head: Normocephalic and atraumatic.  Cardiovascular:     Rate and Rhythm: Normal rate and regular rhythm.     Heart sounds: Normal heart sounds. No murmur heard.    No friction rub. No gallop.  Pulmonary:     Effort: Pulmonary effort is normal. No tachypnea or respiratory distress.     Breath sounds: Normal breath sounds. No decreased breath sounds, wheezing, rhonchi or rales.  Chest:     Chest wall: No tenderness.  Abdominal:     General: Bowel sounds are normal.     Palpations: Abdomen is soft.  Musculoskeletal:        General: Normal range of motion.     Cervical back: Normal range of motion.  Skin:    General: Skin is warm and dry.  Neurological:     Mental Status: She is alert and oriented to person, place, and time.     Coordination: Coordination normal.  Psychiatric:        Behavior: Behavior normal. Behavior is cooperative.        Thought Content: Thought content normal.        Judgment: Judgment normal.          Patient has been counseled extensively about nutrition and exercise as well as the importance of adherence with  medications and regular follow-up. The patient was given clear instructions to go to ER or return to medical center if symptoms don't improve, worsen or new problems develop. The patient verbalized understanding.   Follow-up: Return for 4-6 weeks cbc see me in 3 months.   Haze LELON Servant, FNP-BC [facility] [place], [place] [phone]   12/17/2023, 9:39 AM

## 2023-12-18 LAB — CBC WITH DIFFERENTIAL/PLATELET
Basophils Absolute: 0 10*3/uL (ref 0.0–0.2)
Basos: 1 %
EOS (ABSOLUTE): 0.1 10*3/uL (ref 0.0–0.4)
Eos: 2 %
Hematocrit: 38.5 % (ref 34.0–46.6)
Hemoglobin: 11.5 g/dL (ref 11.1–15.9)
Immature Grans (Abs): 0 10*3/uL (ref 0.0–0.1)
Immature Granulocytes: 0 %
Lymphocytes Absolute: 2.8 10*3/uL (ref 0.7–3.1)
Lymphs: 32 %
MCH: 24.7 pg — ABNORMAL LOW (ref 26.6–33.0)
MCHC: 29.9 g/dL — ABNORMAL LOW (ref 31.5–35.7)
MCV: 83 fL (ref 79–97)
Monocytes Absolute: 0.7 10*3/uL (ref 0.1–0.9)
Monocytes: 8 %
Neutrophils Absolute: 5.2 10*3/uL (ref 1.4–7.0)
Neutrophils: 57 %
Platelets: 298 10*3/uL (ref 150–450)
RBC: 4.65 x10E6/uL (ref 3.77–5.28)
RDW: 14.4 % (ref 11.7–15.4)
WBC: 8.8 10*3/uL (ref 3.4–10.8)

## 2023-12-20 ENCOUNTER — Ambulatory Visit: Payer: Self-pay | Admitting: Nurse Practitioner

## 2024-01-14 ENCOUNTER — Ambulatory Visit: Payer: Self-pay | Attending: Nurse Practitioner

## 2024-01-14 DIAGNOSIS — D649 Anemia, unspecified: Secondary | ICD-10-CM

## 2024-01-15 LAB — CBC WITH DIFFERENTIAL/PLATELET
Basophils Absolute: 0.1 x10E3/uL (ref 0.0–0.2)
Basos: 1 %
EOS (ABSOLUTE): 0.1 x10E3/uL (ref 0.0–0.4)
Eos: 1 %
Hematocrit: 40.5 % (ref 34.0–46.6)
Hemoglobin: 12.1 g/dL (ref 11.1–15.9)
Immature Grans (Abs): 0 x10E3/uL (ref 0.0–0.1)
Immature Granulocytes: 0 %
Lymphocytes Absolute: 2.3 x10E3/uL (ref 0.7–3.1)
Lymphs: 27 %
MCH: 24.9 pg — ABNORMAL LOW (ref 26.6–33.0)
MCHC: 29.9 g/dL — ABNORMAL LOW (ref 31.5–35.7)
MCV: 83 fL (ref 79–97)
Monocytes Absolute: 0.6 x10E3/uL (ref 0.1–0.9)
Monocytes: 7 %
Neutrophils Absolute: 5.5 x10E3/uL (ref 1.4–7.0)
Neutrophils: 64 %
Platelets: 325 x10E3/uL (ref 150–450)
RBC: 4.86 x10E6/uL (ref 3.77–5.28)
RDW: 13.9 % (ref 11.7–15.4)
WBC: 8.5 x10E3/uL (ref 3.4–10.8)

## 2024-01-24 ENCOUNTER — Ambulatory Visit: Payer: Self-pay | Admitting: Nurse Practitioner

## 2024-03-17 ENCOUNTER — Telehealth: Payer: Self-pay | Admitting: Nurse Practitioner

## 2024-03-17 NOTE — Telephone Encounter (Signed)
 Pt unconfirmed appt 10/1 (per vr )

## 2024-03-19 ENCOUNTER — Encounter: Payer: Self-pay | Admitting: Nurse Practitioner

## 2024-03-19 ENCOUNTER — Ambulatory Visit: Payer: Self-pay | Attending: Nurse Practitioner | Admitting: Nurse Practitioner

## 2024-03-19 DIAGNOSIS — K029 Dental caries, unspecified: Secondary | ICD-10-CM

## 2024-03-19 DIAGNOSIS — D500 Iron deficiency anemia secondary to blood loss (chronic): Secondary | ICD-10-CM

## 2024-03-19 DIAGNOSIS — F17200 Nicotine dependence, unspecified, uncomplicated: Secondary | ICD-10-CM

## 2024-03-19 DIAGNOSIS — I10 Essential (primary) hypertension: Secondary | ICD-10-CM

## 2024-03-19 DIAGNOSIS — Z79899 Other long term (current) drug therapy: Secondary | ICD-10-CM

## 2024-03-19 DIAGNOSIS — K219 Gastro-esophageal reflux disease without esophagitis: Secondary | ICD-10-CM

## 2024-03-19 MED ORDER — ALBUTEROL SULFATE HFA 108 (90 BASE) MCG/ACT IN AERS
1.0000 | INHALATION_SPRAY | Freq: Four times a day (QID) | RESPIRATORY_TRACT | 1 refills | Status: AC | PRN
Start: 2024-03-19 — End: ?

## 2024-03-19 MED ORDER — SPIRONOLACTONE 50 MG PO TABS: 50.0000 mg | ORAL_TABLET | Freq: Every day | ORAL | 2 refills | Status: AC

## 2024-03-19 MED ORDER — PANTOPRAZOLE SODIUM 40 MG PO TBEC
40.0000 mg | DELAYED_RELEASE_TABLET | Freq: Every day | ORAL | 3 refills | Status: AC
Start: 2024-03-19 — End: ?

## 2024-03-19 MED ORDER — VALSARTAN 320 MG PO TABS: 320.0000 mg | ORAL_TABLET | Freq: Every day | ORAL | 3 refills | Status: AC

## 2024-03-19 MED ORDER — AMLODIPINE BESYLATE 10 MG PO TABS
10.0000 mg | ORAL_TABLET | Freq: Every day | ORAL | 2 refills | Status: AC
Start: 2024-03-19 — End: ?

## 2024-03-19 MED ORDER — IBUPROFEN 800 MG PO TABS: 800.0000 mg | ORAL_TABLET | Freq: Three times a day (TID) | ORAL | 1 refills | Status: AC | PRN

## 2024-03-19 MED ORDER — IRON (FERROUS SULFATE) 325 (65 FE) MG PO TABS: 325.0000 mg | ORAL_TABLET | ORAL | 3 refills | Status: AC

## 2024-03-19 NOTE — Progress Notes (Signed)
 Assessment & Plan:  Sarah Small was seen today for hypertension.  Diagnoses and all orders for this visit:  Primary hypertension -     valsartan  (DIOVAN ) 320 MG tablet; Take 1 tablet (320 mg total) by mouth daily. -     spironolactone  (ALDACTONE ) 50 MG tablet; Take 1 tablet (50 mg total) by mouth daily. -     amLODipine  (NORVASC ) 10 MG tablet; Take 1 tablet (10 mg total) by mouth daily. -     CMP14+EGFR Continue all antihypertensives as prescribed.  Reminded to bring in blood pressure log for follow  up appointment.  RECOMMENDATIONS: DASH/Mediterranean Diets are healthier choices for HTN.    Gastroesophageal reflux disease without esophagitis -     pantoprazole  (PROTONIX ) 40 MG tablet; Take 1 tablet (40 mg total) by mouth daily.  Anemia due to blood loss, chronic -     Iron , Ferrous Sulfate , 325 (65 Fe) MG TABS; Take 325 mg by mouth every 3 (three) days. -     CBC with Differential  Pain due to dental caries -     ibuprofen  (ADVIL ) 800 MG tablet; Take 1 tablet (800 mg total) by mouth every 8 (eight) hours as needed.  Tobacco dependence -     albuterol  (VENTOLIN  HFA) 108 (90 Base) MCG/ACT inhaler; Inhale 1-2 puffs into the lungs every 6 (six) hours as needed for wheezing or shortness of breath.    Patient has been counseled on age-appropriate routine health concerns for screening and prevention. These are reviewed and up-to-date. Referrals have been placed accordingly. Immunizations are up-to-date or declined.    Subjective:   Chief Complaint  Patient presents with   Hypertension    Sarah Small 33 y.o. female presents to office today for follow-up to hypertension.  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  She would like to spread out her office visits to every 6 months.   Blood pressure is well-controlled with amlodipine  10 mg daily, spironolactone  50 mg daily, and valsartan  320 mg daily  BP Readings from Last 3 Encounters:   03/19/24 120/78  12/17/23 135/83  09/19/23 122/76    She experiences intermittent dental pain open as needed  She has a history of iron  deficiency anemia due to heavy menstrual cycle.  She takes supplemental iron  for this  Review of Systems  Constitutional:  Negative for fever, malaise/fatigue and weight loss.  HENT: Negative.  Negative for nosebleeds.   Eyes: Negative.  Negative for blurred vision, double vision and photophobia.  Respiratory: Negative.  Negative for cough and shortness of breath.   Cardiovascular: Negative.  Negative for chest pain, palpitations and leg swelling.  Gastrointestinal:  Positive for constipation and heartburn. Negative for nausea and vomiting.  Musculoskeletal: Negative.  Negative for myalgias.  Neurological: Negative.  Negative for dizziness, focal weakness, seizures and headaches.  Psychiatric/Behavioral: Negative.  Negative for suicidal ideas.     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  Family History  Problem Relation Age of Onset   Heart attack Mother    Hypertension Mother    Heart failure Mother        transplant    Social History Reviewed with no changes to be made today.   Outpatient Medications Prior to Visit  Medication Sig Dispense Refill   clobetasol  cream (TEMOVATE ) 0.05 % Apply 1 Application topically 2 (two)  times daily. To hands 30 g 0   albuterol  (VENTOLIN  HFA) 108 (90 Base) MCG/ACT inhaler Inhale 1-2 puffs into the lungs every 6 (six) hours as needed for wheezing or shortness of breath. 6.7 g 0   amLODipine  (NORVASC ) 10 MG tablet Take 1 tablet (10 mg total) by mouth daily. 90 tablet 1   ibuprofen  (ADVIL ) 800 MG tablet Take 1 tablet (800 mg total) by mouth every 8 (eight) hours as needed. 60 tablet 1   Iron , Ferrous Sulfate , 325 (65 Fe) MG TABS Take 325 mg by mouth every 3 (three) days. 30 tablet 1   pantoprazole  (PROTONIX ) 40 MG  tablet Take 1 tablet (40 mg total) by mouth daily. 30 tablet 3   spironolactone  (ALDACTONE ) 50 MG tablet Take 1 tablet (50 mg total) by mouth daily. 90 tablet 1   valsartan  (DIOVAN ) 320 MG tablet Take 1 tablet (320 mg total) by mouth daily. 90 tablet 3   No facility-administered medications prior to visit.    Allergies  Allergen Reactions   Ritalin [Methylphenidate Hcl] Rash       Objective:    BP 120/78 (BP Location: Left Arm, Patient Position: Sitting, Cuff Size: Normal)   Pulse (!) 55   Resp 19   Ht 5' 4 (1.626 m)   Wt 198 lb 6.4 oz (90 kg)   LMP 03/08/2024 (Approximate)   SpO2 100%   BMI 34.06 kg/m  Wt Readings from Last 3 Encounters:  03/19/24 198 lb 6.4 oz (90 kg)  12/17/23 201 lb 9.6 oz (91.4 kg)  09/19/23 206 lb (93.4 kg)    Physical Exam Vitals and nursing note reviewed.  Constitutional:      Appearance: She is well-developed.  HENT:     Head: Normocephalic and atraumatic.  Cardiovascular:     Rate and Rhythm: Regular rhythm. Bradycardia present.     Heart sounds: Normal heart sounds. No murmur heard.    No friction rub. No gallop.  Pulmonary:     Effort: Pulmonary effort is normal. No tachypnea or respiratory distress.     Breath sounds: Normal breath sounds. No decreased breath sounds, wheezing, rhonchi or rales.  Chest:     Chest wall: No tenderness.  Musculoskeletal:        General: Normal range of motion.     Cervical back: Normal range of motion.  Skin:    General: Skin is warm and dry.  Neurological:     Mental Status: She is alert and oriented to person, place, and time.     Coordination: Coordination normal.  Psychiatric:        Behavior: Behavior normal. Behavior is cooperative.        Thought Content: Thought content normal.        Judgment: Judgment normal.          Patient has been counseled extensively about nutrition and exercise as well as the importance of adherence with medications and regular follow-up. The patient was given  clear instructions to go to ER or return to medical center if symptoms don't improve, worsen or new problems develop. The patient verbalized understanding.   Follow-up: Return in about 6 months (around 09/17/2024).   Haze LELON Servant, FNP-BC [facility] and [facility] [place], [place] [phone]   03/19/2024, 10:20 AM

## 2024-03-20 LAB — CBC WITH DIFFERENTIAL/PLATELET
Basophils Absolute: 0.1 x10E3/uL (ref 0.0–0.2)
Basos: 1 %
EOS (ABSOLUTE): 0.1 x10E3/uL (ref 0.0–0.4)
Eos: 1 %
Hematocrit: 39.4 % (ref 34.0–46.6)
Hemoglobin: 12.1 g/dL (ref 11.1–15.9)
Immature Grans (Abs): 0 x10E3/uL (ref 0.0–0.1)
Immature Granulocytes: 0 %
Lymphocytes Absolute: 2.6 x10E3/uL (ref 0.7–3.1)
Lymphs: 30 %
MCH: 25.3 pg — ABNORMAL LOW (ref 26.6–33.0)
MCHC: 30.7 g/dL — ABNORMAL LOW (ref 31.5–35.7)
MCV: 82 fL (ref 79–97)
Monocytes Absolute: 0.6 x10E3/uL (ref 0.1–0.9)
Monocytes: 7 %
Neutrophils Absolute: 5.2 x10E3/uL (ref 1.4–7.0)
Neutrophils: 61 %
Platelets: 325 x10E3/uL (ref 150–450)
RBC: 4.79 x10E6/uL (ref 3.77–5.28)
RDW: 14 % (ref 11.7–15.4)
WBC: 8.7 x10E3/uL (ref 3.4–10.8)

## 2024-03-20 LAB — CMP14+EGFR
ALT: 17 IU/L (ref 0–32)
AST: 14 IU/L (ref 0–40)
Albumin: 4.1 g/dL (ref 3.9–4.9)
Alkaline Phosphatase: 111 IU/L (ref 41–116)
BUN/Creatinine Ratio: 10 (ref 9–23)
BUN: 8 mg/dL (ref 6–20)
Bilirubin Total: 0.3 mg/dL (ref 0.0–1.2)
CO2: 24 mmol/L (ref 20–29)
Calcium: 9.7 mg/dL (ref 8.7–10.2)
Chloride: 103 mmol/L (ref 96–106)
Creatinine, Ser: 0.83 mg/dL (ref 0.57–1.00)
Globulin, Total: 3.2 g/dL (ref 1.5–4.5)
Glucose: 84 mg/dL (ref 70–99)
Potassium: 4.5 mmol/L (ref 3.5–5.2)
Sodium: 139 mmol/L (ref 134–144)
Total Protein: 7.3 g/dL (ref 6.0–8.5)
eGFR: 95 mL/min/1.73 (ref >59)

## 2024-03-23 ENCOUNTER — Ambulatory Visit: Payer: Self-pay | Admitting: Nurse Practitioner

## 2024-03-23 ENCOUNTER — Encounter: Payer: Self-pay | Admitting: Nurse Practitioner

## 2024-03-29 ENCOUNTER — Encounter: Payer: Self-pay | Admitting: Nurse Practitioner

## 2024-03-31 ENCOUNTER — Encounter: Payer: Self-pay | Admitting: Nurse Practitioner

## 2024-09-21 ENCOUNTER — Ambulatory Visit: Payer: Self-pay | Admitting: Nurse Practitioner
